# Patient Record
Sex: Female | Born: 1970 | Race: Black or African American | Hispanic: No | Marital: Married | Smoking: Never smoker
Health system: Southern US, Community
[De-identification: ages and names within clinical notes are randomized; demographics above are authoritative.]

## PROBLEM LIST (undated history)

## (undated) DIAGNOSIS — K219 Gastro-esophageal reflux disease without esophagitis: Secondary | ICD-10-CM

## (undated) DIAGNOSIS — I10 Essential (primary) hypertension: Secondary | ICD-10-CM

## (undated) DIAGNOSIS — D509 Iron deficiency anemia, unspecified: Secondary | ICD-10-CM

## (undated) DIAGNOSIS — K2970 Gastritis, unspecified, without bleeding: Secondary | ICD-10-CM

---

## 2011-07-23 ENCOUNTER — Encounter: Payer: Self-pay | Admitting: *Deleted

## 2011-07-23 ENCOUNTER — Emergency Department (HOSPITAL_BASED_OUTPATIENT_CLINIC_OR_DEPARTMENT_OTHER)
Admission: EM | Admit: 2011-07-23 | Discharge: 2011-07-23 | Disposition: A | Discharge status: Home / Self Care | Payer: 59 | Attending: Emergency Medicine | Admitting: Emergency Medicine

## 2011-07-23 DIAGNOSIS — R197 Diarrhea, unspecified: Secondary | ICD-10-CM | POA: Insufficient documentation

## 2011-07-23 DIAGNOSIS — R51 Headache: Secondary | ICD-10-CM | POA: Insufficient documentation

## 2011-07-23 DIAGNOSIS — IMO0001 Reserved for inherently not codable concepts without codable children: Secondary | ICD-10-CM | POA: Insufficient documentation

## 2011-07-23 DIAGNOSIS — R112 Nausea with vomiting, unspecified: Secondary | ICD-10-CM | POA: Insufficient documentation

## 2011-07-23 LAB — DIFFERENTIAL
Lymphocytes Relative: 23 % (ref 12–46)
Lymphs Abs: 1.3 10*3/uL (ref 0.7–4.0)
Monocytes Absolute: 0.3 10*3/uL (ref 0.1–1.0)
Monocytes Relative: 5 % (ref 3–12)
Neutro Abs: 4.1 10*3/uL (ref 1.7–7.7)
Neutrophils Relative %: 71 % (ref 43–77)

## 2011-07-23 LAB — URINALYSIS, ROUTINE W REFLEX MICROSCOPIC
Hgb urine dipstick: NEGATIVE
Ketones, ur: >80 mg/dL — AB
Protein, ur: 30 mg/dL — AB
Urobilinogen, UA: 1 mg/dL (ref 0.0–1.0)

## 2011-07-23 LAB — CBC
HCT: 32.5 % — ABNORMAL LOW (ref 36.0–46.0)
Hemoglobin: 10.5 g/dL — ABNORMAL LOW (ref 12.0–15.0)
RBC: 3.97 MIL/uL (ref 3.87–5.11)
WBC: 5.8 10*3/uL (ref 4.0–10.5)

## 2011-07-23 LAB — COMPREHENSIVE METABOLIC PANEL
AST: 15 U/L (ref 0–37)
Albumin: 4 g/dL (ref 3.5–5.2)
Alkaline Phosphatase: 89 U/L (ref 39–117)
BUN: 14 mg/dL (ref 6–23)
CO2: 25 mEq/L (ref 19–32)
Chloride: 106 mEq/L (ref 96–112)
Creatinine, Ser: 0.7 mg/dL (ref 0.50–1.10)
GFR calc non Af Amer: >90 mL/min (ref >90)
Potassium: 3.6 mEq/L (ref 3.5–5.1)
Total Bilirubin: 0.6 mg/dL (ref 0.3–1.2)

## 2011-07-23 LAB — URINE MICROSCOPIC-ADD ON

## 2011-07-23 MED ORDER — ONDANSETRON HCL 4 MG/2ML IJ SOLN
INTRAMUSCULAR | Status: AC
  Administered 2011-07-23: 4 mg
  Filled 2011-07-23: qty 2

## 2011-07-23 MED ORDER — SODIUM CHLORIDE 0.9 % IV SOLN
Freq: Once | INTRAVENOUS | Status: AC
  Administered 2011-07-23: 15:00:00 via INTRAVENOUS

## 2011-07-23 MED ORDER — ONDANSETRON 4 MG PO TBDP: 4.0000 mg | ORAL_TABLET | Freq: Three times a day (TID) | ORAL | Status: AC | PRN

## 2011-07-23 MED ORDER — HYDROMORPHONE HCL PF 1 MG/ML IJ SOLN
0.5000 mg | Freq: Once | INTRAMUSCULAR | Status: AC
  Administered 2011-07-23: 17:00:00 via INTRAVENOUS
  Filled 2011-07-23: qty 1

## 2011-07-23 MED ORDER — SODIUM CHLORIDE 0.9 % IV SOLN
Freq: Once | INTRAVENOUS | Status: AC
  Administered 2011-07-23: 17:00:00 via INTRAVENOUS

## 2011-07-23 MED ORDER — ONDANSETRON 8 MG PO TBDP
8.0000 mg | ORAL_TABLET | Freq: Once | ORAL | Status: AC
  Administered 2011-07-23: 8 mg via ORAL
  Filled 2011-07-23: qty 1

## 2011-07-23 NOTE — ED Notes (Signed)
Nausea vomiting diarrhea onset 4 days ago cant keep anything down also with chills headache feeling weak and generalized body aching

## 2011-07-23 NOTE — ED Provider Notes (Signed)
History     CSN: [id number] Arrival date & time: 07/23/2011  2:17 PM   First MD Initiated Contact with Patient 07/23/11 1449      Chief Complaint  Patient presents with  . Emesis  . Diarrhea  . Generalized Body Aches  . Headache    (Consider location/radiation/quality/duration/timing/severity/associated sxs/prior treatment) Patient is a 40 y.o. female presenting with vomiting, diarrhea, and headaches. The history is provided by the patient. No language interpreter was used.  Emesis  This is a new problem. The current episode started yesterday. The problem has been gradually worsening. The emesis has an appearance of stomach contents. The maximum temperature recorded prior to her arrival was 100 to 100.9 F. Associated symptoms include chills, diarrhea and headaches. Risk factors include ill contacts.  Diarrhea The primary symptoms include vomiting and diarrhea.  The illness is also significant for chills.  Headache  Associated symptoms include vomiting.  Pt complains of vomitting and diarrhea for the past 4 days.  History reviewed. No pertinent past medical history.  History reviewed. No pertinent past surgical history.  History reviewed. No pertinent family history.  History  Substance Use Topics  . Smoking status: Never Smoker   . Smokeless tobacco: Not on file  . Alcohol Use: No    OB History    Grav Para Term Preterm Abortions TAB SAB Ect Mult Living                  Review of Systems  Constitutional: Positive for chills.  Gastrointestinal: Positive for vomiting and diarrhea.  Neurological: Positive for headaches.  All other systems reviewed and are negative.    Allergies  Review of patient's allergies indicates no known allergies.  Home Medications  No current outpatient prescriptions on file.  BP 123/79  Pulse 70  Temp(Src) 98.8 F (37.1 C) (Oral)  Resp 20  Ht 5' (1.524 m)  Wt 165 lb (74.844 kg)  BMI 32.22 kg/m2  SpO2 100%  LMP  07/15/2011  Physical Exam  Nursing note and vitals reviewed. Constitutional: She is oriented to person, place, and time. She appears well-developed and well-nourished.  HENT:  Head: Normocephalic and atraumatic.  Right Ear: External ear normal.  Left Ear: External ear normal.  Nose: Nose normal.  Mouth/Throat: Oropharynx is clear and moist.  Eyes: Conjunctivae and EOM are normal. Pupils are equal, round, and reactive to light.  Neck: Normal range of motion. Neck supple.  Cardiovascular: Normal rate and normal heart sounds.   Pulmonary/Chest: Effort normal and breath sounds normal.  Abdominal: Soft.  Musculoskeletal: Normal range of motion.  Neurological: She is alert and oriented to person, place, and time. She has normal reflexes.  Skin: Skin is warm.  Psychiatric: She has a normal mood and affect.    ED Course  Procedures (including critical care time)  Labs Reviewed  URINALYSIS, ROUTINE W REFLEX MICROSCOPIC - Abnormal; Notable for the following:    Bilirubin Urine SMALL (*)    Ketones, ur >80 (*)    Protein, ur 30 (*)    All other components within normal limits  URINE MICROSCOPIC-ADD ON - Abnormal; Notable for the following:    Squamous Epithelial / LPF FEW (*) RARE   Bacteria, UA FEW (*)    All other components within normal limits  CBC - Abnormal; Notable for the following:    Hemoglobin 10.5 (*)    HCT 32.5 (*)    All other components within normal limits  PREGNANCY, URINE  DIFFERENTIAL  COMPREHENSIVE  METABOLIC PANEL   No results found.   No diagnosis found.    MDM  Pt given IV fluids x 2 liters.  Pt feels better,  Zofran IV,   Labs normal,  Urine shows 80 ketones,  Pt advised to continue po fluids.  Pt given rx for zofran.        Langston Masker, [place] 07/23/11 [phone]

## 2011-07-25 NOTE — ED Provider Notes (Signed)
History/physical exam/procedure(s) were performed by non-physician practitioner and as supervising physician I was immediately available for consultation/collaboration. I have reviewed all notes and am in agreement with care and plan.   Cortnie Ringel S Froylan Hobby, MD 07/25/11 0418 

## 2011-10-26 ENCOUNTER — Encounter (HOSPITAL_BASED_OUTPATIENT_CLINIC_OR_DEPARTMENT_OTHER): Payer: Self-pay | Admitting: *Deleted

## 2011-10-26 ENCOUNTER — Emergency Department (HOSPITAL_BASED_OUTPATIENT_CLINIC_OR_DEPARTMENT_OTHER)
Admission: EM | Admit: 2011-10-26 | Discharge: 2011-10-27 | Disposition: A | Discharge status: Home / Self Care | Payer: 59 | Attending: Emergency Medicine | Admitting: Emergency Medicine

## 2011-10-26 DIAGNOSIS — R51 Headache: Secondary | ICD-10-CM | POA: Insufficient documentation

## 2011-10-26 DIAGNOSIS — N760 Acute vaginitis: Secondary | ICD-10-CM | POA: Insufficient documentation

## 2011-10-26 DIAGNOSIS — A499 Bacterial infection, unspecified: Secondary | ICD-10-CM | POA: Insufficient documentation

## 2011-10-26 DIAGNOSIS — N920 Excessive and frequent menstruation with regular cycle: Secondary | ICD-10-CM | POA: Insufficient documentation

## 2011-10-26 DIAGNOSIS — N946 Dysmenorrhea, unspecified: Secondary | ICD-10-CM

## 2011-10-26 DIAGNOSIS — B9689 Other specified bacterial agents as the cause of diseases classified elsewhere: Secondary | ICD-10-CM | POA: Insufficient documentation

## 2011-10-26 LAB — CBC
MCH: 26.5 pg (ref 26.0–34.0)
MCV: 82 fL (ref 78.0–100.0)
Platelets: 337 10*3/uL (ref 150–400)
RBC: 3.66 MIL/uL — ABNORMAL LOW (ref 3.87–5.11)
RDW: 15.7 % — ABNORMAL HIGH (ref 11.5–15.5)
WBC: 7.9 10*3/uL (ref 4.0–10.5)

## 2011-10-26 LAB — URINALYSIS, ROUTINE W REFLEX MICROSCOPIC
Bilirubin Urine: NEGATIVE
Ketones, ur: NEGATIVE mg/dL
Leukocytes, UA: NEGATIVE
Nitrite: NEGATIVE
Urobilinogen, UA: 1 mg/dL (ref 0.0–1.0)
pH: 7 (ref 5.0–8.0)

## 2011-10-26 LAB — WET PREP, GENITAL
Trich, Wet Prep: NONE SEEN
WBC, Wet Prep HPF POC: NONE SEEN

## 2011-10-26 LAB — URINE MICROSCOPIC-ADD ON

## 2011-10-26 MED ORDER — OXYCODONE-ACETAMINOPHEN 5-325 MG PO TABS: 1.0000 | ORAL_TABLET | ORAL | Status: AC | PRN

## 2011-10-26 MED ORDER — METRONIDAZOLE 500 MG PO TABS: 500.0000 mg | ORAL_TABLET | Freq: Two times a day (BID) | ORAL | Status: AC

## 2011-10-26 MED ORDER — ONDANSETRON HCL 4 MG/2ML IJ SOLN
4.0000 mg | Freq: Once | INTRAMUSCULAR | Status: AC
  Administered 2011-10-26: 4 mg via INTRAVENOUS
  Filled 2011-10-26: qty 2

## 2011-10-26 MED ORDER — HYDROMORPHONE HCL PF 1 MG/ML IJ SOLN
1.0000 mg | Freq: Once | INTRAMUSCULAR | Status: AC
  Administered 2011-10-26: 1 mg via INTRAVENOUS
  Filled 2011-10-26: qty 1

## 2011-10-26 MED ORDER — KETOROLAC TROMETHAMINE 30 MG/ML IJ SOLN
30.0000 mg | Freq: Once | INTRAMUSCULAR | Status: AC
  Administered 2011-10-26: 30 mg via INTRAVENOUS
  Filled 2011-10-26: qty 1

## 2011-10-26 MED ORDER — MEDROXYPROGESTERONE ACETATE 10 MG PO TABS: 10.0000 mg | ORAL_TABLET | Freq: Every day | ORAL | Status: DC

## 2011-10-26 NOTE — ED Notes (Signed)
Pt having vaginal bleeding, stated "went through one box of pads today & having severe headaches".

## 2011-10-26 NOTE — Discharge Instructions (Signed)
Abnormal Uterine Bleeding Abnormal uterine bleeding can have many causes. Some cases are simply treated, while others are more serious. There are several kinds of bleeding that is considered abnormal, including:  Bleeding between periods.   Bleeding after sexual intercourse.   Spotting anytime in the menstrual cycle.   Bleeding heavier or more than normal.   Bleeding after menopause.  CAUSES  There are many causes of abnormal uterine bleeding. It can be present in teenagers, pregnant women, women during their reproductive years, and women who have reached menopause. Your caregiver will look for the more common causes depending on your age, signs, symptoms and your particular circumstance. Most cases are not serious and can be treated. Even the more serious causes, like cancer of the female organs, can be treated adequately if found in the early stages. That is why all types of bleeding should be evaluated and treated as soon as possible. DIAGNOSIS  Diagnosing the cause may take several kinds of tests. Your caregiver may:  Take a complete history of the type of bleeding.   Perform a complete physical exam and Pap smear.   Take an ultrasound on the abdomen showing a picture of the female organs and the pelvis.   Inject dye into the uterus and Fallopian tubes and X-ray them (hysterosalpingogram).   Place fluid in the uterus and do an ultrasound (sonohysterogrqphy).   Take a CT scan to examine the female organs and pelvis.   Take an MRI to examine the female organs and pelvis. There is no X-ray involved with this procedure.   Look inside the uterus with a telescope that has a light at the end (hysteroscopy).   Scrap the inside of the uterus to get tissue to examine (Dilatation and Curettage, D&C).   Look into the pelvis with a telescope that has a light at the end (laparoscopy). This is done through a very small cut (incision) in the abdomen.  TREATMENT  Treatment will depend on the  cause of the abnormal bleeding. It can include:  Doing nothing to allow the problem to take care of itself over time.   Hormone treatment.   Birth control pills.   Treating the medical condition causing the problem.   Laparoscopy.   Major or minor surgery   Destroying the lining of the uterus with electrical currant, laser, freezing or heat (uterine ablation).  HOME CARE INSTRUCTIONS   Follow your caregiver's recommendation on how to treat your problem.   See your caregiver if you missed a menstrual period and think you may be pregnant.   If you are bleeding heavily, count the number of pads/tampons you use and how often you have to change them. Tell this to your caregiver.   Avoid sexual intercourse until the problem is controlled.  SEEK MEDICAL CARE IF:   You have any kind of abnormal bleeding mentioned above.   You feel dizzy at times.   You are 41 years old and have not had a menstrual period yet.  SEEK IMMEDIATE MEDICAL CARE IF:   You pass out.   You are changing pads/tampons every 15 to 30 minutes.   You have belly (abdominal) pain.   You have a temperature of 100 F (37.8 C) or higher.   You become sweaty or weak.   You are passing large blood clots from the vagina.   You start to feel sick to your stomach (nauseous) and throw up (vomit).  Document Released: 08/06/2005 Document Revised: 07/26/2011 Document Reviewed: 12/30/2008 [employer]   Patient Information 2012 [place], [place].  Bacterial Vaginosis Bacterial vaginosis (BV) is a vaginal infection where the normal balance of bacteria in the vagina is disrupted. The normal balance is then replaced by an overgrowth of certain bacteria. There are several different kinds of bacteria that can cause BV. BV is the most common vaginal infection in women of childbearing age. CAUSES   The cause of BV is not fully understood. BV develops when there is an increase or imbalance of harmful bacteria.   Some activities or  behaviors can upset the normal balance of bacteria in the vagina and put women at increased risk including:   Having a new sex partner or multiple sex partners.   Douching.   Using an intrauterine device (IUD) for contraception.   It is not clear what role sexual activity plays in the development of BV. However, women that have never had sexual intercourse are rarely infected with BV.  Women do not get BV from toilet seats, bedding, swimming pools or from touching objects around them.  SYMPTOMS   Grey vaginal discharge.   A fish-like odor with discharge, especially after sexual intercourse.   Itching or burning of the vagina and vulva.   Burning or pain with urination.   Some women have no signs or symptoms at all.  DIAGNOSIS  Your caregiver must examine the vagina for signs of BV. Your caregiver will perform lab tests and look at the sample of vaginal fluid through a microscope. They will look for bacteria and abnormal cells (clue cells), a pH test higher than 4.5, and a positive amine test all associated with BV.  RISKS AND COMPLICATIONS   Pelvic inflammatory disease (PID).   Infections following gynecology surgery.   Developing HIV.   Developing herpes virus.  TREATMENT  Sometimes BV will clear up without treatment. However, all women with symptoms of BV should be treated to avoid complications, especially if gynecology surgery is planned. Female partners generally do not need to be treated. However, BV may spread between female sex partners so treatment is helpful in preventing a recurrence of BV.   BV may be treated with antibiotics. The antibiotics come in either pill or vaginal cream forms. Either can be used with nonpregnant or pregnant women, but the recommended dosages differ. These antibiotics are not harmful to the baby.   BV can recur after treatment. If this happens, a second round of antibiotics will often be prescribed.   Treatment is important for pregnant women.  If not treated, BV can cause a premature delivery, especially for a pregnant woman who had a premature birth in the past. All pregnant women who have symptoms of BV should be checked and treated.   For chronic reoccurrence of BV, treatment with a type of prescribed gel vaginally twice a week is helpful.  HOME CARE INSTRUCTIONS   Finish all medication as directed by your caregiver.   Do not have sex until treatment is completed.   Tell your sexual partner that you have a vaginal infection. They should see their caregiver and be treated if they have problems, such as a mild rash or itching.   Practice safe sex. Use condoms. Only have 1 sex partner.  PREVENTION  Basic prevention steps can help reduce the risk of upsetting the natural balance of bacteria in the vagina and developing BV:  Do not have sexual intercourse (be abstinent).   Do not douche.   Use all of the medicine prescribed for treatment of BV, even  if the signs and symptoms go away.   Tell your sex partner if you have BV. That way, they can be treated, if needed, to prevent reoccurrence.  SEEK MEDICAL CARE IF:   Your symptoms are not improving after 3 days of treatment.   You have increased discharge, pain, or fever.  MAKE SURE YOU:   Understand these instructions.   Will watch your condition.   Will get help right away if you are not doing well or get worse.  FOR MORE INFORMATION  Division of STD Prevention (DSTDP), Centers for Disease Control and Prevention: [employer] Association (ASHA): www.ashastd.org  Document Released: 08/06/2005 Document Revised: 07/26/2011 Document Reviewed: 01/27/2009 [facility] Patient Information 2012 [place], [place].  Metronidazole tablets or capsules What is this medicine? METRONIDAZOLE (me troe NI da zole) is an antiinfective. It is used to treat certain kinds of bacterial and protozoal infections. It will not work for colds, flu, or other viral  infections. This medicine may be used for other purposes; ask your health care provider or pharmacist if you have questions. What should I tell my health care provider before I take this medicine? They need to know if you have any of these conditions: -anemia or other blood disorders -disease of the nervous system -fungal or yeast infection -if you drink alcohol containing drinks -liver disease -seizures -an unusual or allergic reaction to metronidazole, or other medicines, foods, dyes, or preservatives -pregnant or trying to get pregnant -breast-feeding How should I use this medicine? Take this medicine by mouth with a full glass of water. Follow the directions on the prescription label. Take your medicine at regular intervals. Do not take your medicine more often than directed. Take all of your medicine as directed even if you think you are better. Do not skip doses or stop your medicine early. Talk to your pediatrician regarding the use of this medicine in children. Special care may be needed. Overdosage: If you think you have taken too much of this medicine contact a poison control center or emergency room at once. NOTE: This medicine is only for you. Do not share this medicine with others. What if I miss a dose? If you miss a dose, take it as soon as you can. If it is almost time for your next dose, take only that dose. Do not take double or extra doses. What may interact with this medicine? Do not take this medicine with any of the following medications: -alcohol or any product that contains alcohol -amprenavir oral solution -disulfiram -paclitaxel injection -ritonavir oral solution -sertraline oral solution -sulfamethoxazole-trimethoprim injection This medicine may also interact with the following medications: -cimetidine -lithium -phenobarbital -phenytoin -warfarin This list may not describe all possible interactions. Give your health care provider a list of all the  medicines, herbs, non-prescription drugs, or dietary supplements you use. Also tell them if you smoke, drink alcohol, or use illegal drugs. Some items may interact with your medicine. What should I watch for while using this medicine? Tell your doctor or health care professional if your symptoms do not improve or if they get worse. You may get drowsy or dizzy. Do not drive, use machinery, or do anything that needs mental alertness until you know how this medicine affects you. Do not stand or sit up quickly, especially if you are an older patient. This reduces the risk of dizzy or fainting spells. Avoid alcoholic drinks while you are taking this medicine and for three days afterward. Alcohol may make you feel  dizzy, sick, or flushed. If you are being treated for a sexually transmitted disease, avoid sexual contact until you have finished your treatment. Your sexual partner may also need treatment. What side effects may I notice from receiving this medicine? Side effects that you should report to your doctor or health care professional as soon as possible: -allergic reactions like skin rash or hives, swelling of the face, lips, or tongue -confusion, clumsiness -difficulty speaking -discolored or sore mouth -dizziness -fever, infection -numbness, tingling, pain or weakness in the hands or feet -trouble passing urine or change in the amount of urine -redness, blistering, peeling or loosening of the skin, including inside the mouth -seizures -unusually weak or tired -vaginal irritation, dryness, or discharge Side effects that usually do not require medical attention (report to your doctor or health care professional if they continue or are bothersome): -diarrhea -headache -irritability -metallic taste -nausea -stomach pain or cramps -trouble sleeping This list may not describe all possible side effects. Call your doctor for medical advice about side effects. You may report side effects to FDA  at 1-800-FDA-1088. Where should I keep my medicine? Keep out of the reach of children. Store at room temperature below 25 degrees C (77 degrees F). Protect from light. Keep container tightly closed. Throw away any unused medicine after the expiration date. NOTE: This sheet is a summary. It may not cover all possible information. If you have questions about this medicine, talk to your doctor, pharmacist, or health care provider.  2012, [employer]. (05/24/2008 10:37:11 AM)Medroxyprogesterone tablets What is this medicine? MEDROXYPROGESTERONE (me DROX ee proe JES te rone) is a hormone in a class called progestins. It is commonly used to prevent the uterine lining from overgrowth in women taking an estrogen after menopause. It is also used to treat irregular menstrual bleeding or a lack of menstrual bleeding in women. This medicine may be used for other purposes; ask your health care provider or pharmacist if you have questions. What should I tell my health care provider before I take this medicine? They need to know if you have any of these conditions: -blood vessel disease or a history of a blood clot in the lungs or legs -breast, cervical or vaginal cancer -heart disease -kidney disease -liver disease -migraine -recent miscarriage or abortion -mental depression -migraine -seizures (convulsions) -stroke -vaginal bleeding that has not been evaluated -an unusual or allergic reaction to medroxyprogesterone, other medicines, foods, dyes, or preservatives -pregnant or trying to get pregnant -breast-feeding How should I use this medicine? Take this medicine by mouth with a glass of water. Follow the directions on the prescription label. Take your doses at regular intervals. Do not take your medicine more often than directed. Talk to your pediatrician regarding the use of this medicine in children. Special care may be needed. While this drug may be prescribed for children as young as  13 years for selected conditions, precautions do apply. Overdosage: If you think you have taken too much of this medicine contact a poison control center or emergency room at once. NOTE: This medicine is only for you. Do not share this medicine with others. What if I miss a dose? If you miss a dose, take it as soon as you can. If it is almost time for your next dose, take only that dose. Do not take double or extra doses. What may interact with this medicine? -barbiturate medicines for inducing sleep or treating seizures (convulsions) -bosentan -carbamazepine -phenytoin -rifampin -St. John's Wort This list may  not describe all possible interactions. Give your health care provider a list of all the medicines, herbs, non-prescription drugs, or dietary supplements you use. Also tell them if you smoke, drink alcohol, or use illegal drugs. Some items may interact with your medicine. What should I watch for while using this medicine? Visit your health care professional for regular checks on your progress. You will need a regular breast and pelvic exam. If you have any reason to think you are pregnant, stop taking this medicine at once and contact your doctor or health care professional. What side effects may I notice from receiving this medicine? Side effects that you should report to your doctor or health care professional as soon as possible: -breast tenderness or discharge -changes in mood or emotions, such as depression -changes in vision or speech -pain in the abdomen, chest, groin, or leg -severe headache -skin rash, itching, or hives -sudden shortness of breath -unusually weak or tired -yellowing of skin or eyes Side effects that usually do not require medical attention (report to your doctor or health care professional if they continue or are bothersome): -acne -change in menstrual bleeding pattern or flow -changes in sexual desire -facial hair growth -fluid retention and  swelling -headache -upset stomach -weight gain or loss This list may not describe all possible side effects. Call your doctor for medical advice about side effects. You may report side effects to FDA at 1-800-FDA-1088. Where should I keep my medicine? Keep out of the reach of children. Store at room temperature between 20 and 25 degrees C (68 and 77 degrees F). Throw away any unused medicine after the expiration date. NOTE: This sheet is a summary. It may not cover all possible information. If you have questions about this medicine, talk to your doctor, pharmacist, or health care provider.  2012, [employer] Standard. (08/05/2008 11:26:12 AM)  Acetaminophen; Oxycodone tablets What is this medicine? ACETAMINOPHEN; OXYCODONE (a set a MEE noe fen; ox i KOE done) is a pain reliever. It is used to treat mild to moderate pain. This medicine may be used for other purposes; ask your health care provider or pharmacist if you have questions. What should I tell my health care provider before I take this medicine? They need to know if you have any of these conditions: -brain tumor -Crohn's disease, inflammatory bowel disease, or ulcerative colitis -drink more than 3 alcohol containing drinks per day -drug abuse or addiction -head injury -heart or circulation problems -kidney disease or problems going to the bathroom -liver disease -lung disease, asthma, or breathing problems -an unusual or allergic reaction to acetaminophen, oxycodone, other opioid analgesics, other medicines, foods, dyes, or preservatives -pregnant or trying to get pregnant -breast-feeding How should I use this medicine? Take this medicine by mouth with a full glass of water. Follow the directions on the prescription label. Take your medicine at regular intervals. Do not take your medicine more often than directed. Talk to your pediatrician regarding the use of this medicine in children. Special care may be needed. Patients  over 28 years old may have a stronger reaction and need a smaller dose. Overdosage: If you think you have taken too much of this medicine contact a poison control center or emergency room at once. NOTE: This medicine is only for you. Do not share this medicine with others. What if I miss a dose? If you miss a dose, take it as soon as you can. If it is almost time for your next dose, take only  that dose. Do not take double or extra doses. What may interact with this medicine? -alcohol or medicines that contain alcohol -antihistamines -barbiturates like amobarbital, butalbital, butabarbital, methohexital, pentobarbital, phenobarbital, thiopental, and secobarbital -benztropine -drugs for bladder problems like solifenacin, trospium, oxybutynin, tolterodine, hyoscyamine, and methscopolamine -drugs for breathing problems like ipratropium and tiotropium -drugs for certain stomach or intestine problems like propantheline, homatropine methylbromide, glycopyrrolate, atropine, belladonna, and dicyclomine -general anesthetics like etomidate, ketamine, nitrous oxide, propofol, desflurane, enflurane, halothane, isoflurane, and sevoflurane -medicines for depression, anxiety, or psychotic disturbances -medicines for pain like codeine, morphine, pentazocine, buprenorphine, butorphanol, nalbuphine, tramadol, and propoxyphene -medicines for sleep -muscle relaxants -naltrexone -phenothiazines like perphenazine, thioridazine, chlorpromazine, mesoridazine, fluphenazine, prochlorperazine, promazine, and trifluoperazine -scopolamine -trihexyphenidyl This list may not describe all possible interactions. Give your health care provider a list of all the medicines, herbs, non-prescription drugs, or dietary supplements you use. Also tell them if you smoke, drink alcohol, or use illegal drugs. Some items may interact with your medicine. What should I watch for while using this medicine? Tell your doctor or health care  professional if your pain does not go away, if it gets worse, or if you have new or a different type of pain. You may develop tolerance to the medicine. Tolerance means that you will need a higher dose of the medication for pain relief. Tolerance is normal and is expected if you take this medicine for a long time. Do not suddenly stop taking your medicine because you may develop a severe reaction. Your body becomes used to the medicine. This does NOT mean you are addicted. Addiction is a behavior related to getting and using a drug for a nonmedical reason. If you have pain, you have a medical reason to take pain medicine. Your doctor will tell you how much medicine to take. If your doctor wants you to stop the medicine, the dose will be slowly lowered over time to avoid any side effects. You may get drowsy or dizzy. Do not drive, use machinery, or do anything that needs mental alertness until you know how this medicine affects you. Do not stand or sit up quickly, especially if you are an older patient. This reduces the risk of dizzy or fainting spells. Alcohol may interfere with the effect of this medicine. Avoid alcoholic drinks. The medicine will cause constipation. Try to have a bowel movement at least every 2 to 3 days. If you do not have a bowel movement for 3 days, call your doctor or health care professional. Do not take Tylenol (acetaminophen) or medicines that have acetaminophen with this medicine. Too much acetaminophen can be very dangerous. Many nonprescription medicines contain acetaminophen. Always read the labels carefully to avoid taking more acetaminophen. What side effects may I notice from receiving this medicine? Side effects that you should report to your doctor or health care professional as soon as possible: -allergic reactions like skin rash, itching or hives, swelling of the face, lips, or tongue -breathing difficulties, wheezing -confusion -light headedness or fainting  spells -severe stomach pain -yellowing of the skin or the whites of the eyes Side effects that usually do not require medical attention (report to your doctor or health care professional if they continue or are bothersome): -dizziness -drowsiness -nausea -vomiting This list may not describe all possible side effects. Call your doctor for medical advice about side effects. You may report side effects to FDA at 1-800-FDA-1088. Where should I keep my medicine? Keep out of the reach of children. This medicine can  be abused. Keep your medicine in a safe place to protect it from theft. Do not share this medicine with anyone. Selling or giving away this medicine is dangerous and against the law. Store at room temperature between 20 and 25 degrees C (68 and 77 degrees F). Keep container tightly closed. Protect from light. Flush any unused medicines down the toilet. Do not use the medicine after the expiration date. NOTE: This sheet is a summary. It may not cover all possible information. If you have questions about this medicine, talk to your doctor, pharmacist, or health care provider.  2012, [employer] Standard. (07/05/2008 10:01:21 AM)

## 2011-10-26 NOTE — ED Provider Notes (Signed)
History     CSN: [id number]  Arrival date & time 10/26/11  7829   First MD Initiated Contact with Patient 10/26/11 1951      Chief Complaint  Patient presents with  . Vaginal Bleeding    (Consider location/radiation/quality/duration/timing/severity/associated sxs/prior treatment) Patient is a 41 y.o. female presenting with vaginal bleeding. The history is provided by the patient.  Vaginal Bleeding   she had onset of vaginal bleeding and suprapubic cramping yesterday. This is associated with a mild headache. This was appropriate time for onset of her menses. However, her pain and headache and amount of bleeding are much more than normal. Today she is going through the entire box of pads. She has been passing clots. Cramping has been severe and she rates it at 7/10 currently but has been as severe as 8/10. It is generally worse just prior to passing a clot. Headache is bitemporal. She states that she uses condoms with every episode of intercourse and denies unprotected sex. She has not had any fever, chills, sweats. There's been no nausea or vomiting or diarrhea.  History reviewed. No pertinent past medical history.  History reviewed. No pertinent past surgical history.  History reviewed. No pertinent family history.  History  Substance Use Topics  . Smoking status: Never Smoker   . Smokeless tobacco: Not on file  . Alcohol Use: No    OB History    Grav Para Term Preterm Abortions TAB SAB Ect Mult Living                  Review of Systems  Genitourinary: Positive for vaginal bleeding.  All other systems reviewed and are negative.    Allergies  Review of patient's allergies indicates no known allergies.  Home Medications   Current Outpatient Rx  Name Route Sig Dispense Refill  . NAPROXEN SODIUM 220 MG PO TABS Oral Take 220 mg by mouth 2 (two) times daily with a meal. Patient took this medication for cramps.      BP 153/80  Pulse 83  Temp(Src) 98.1 F (36.7 C)  (Oral)  Resp 18  Ht 5' (1.524 m)  Wt 160 lb (72.576 kg)  BMI 31.25 kg/m2  SpO2 100%  LMP 10/25/2011  Physical Exam  Nursing note and vitals reviewed.  41 year old female who is resting comfortably and in no acute distress. Vital signs are significant for mild hypertension with blood pressure 149/90. Oxygen saturation is 100% which is normal. It is normocephalic and atraumatic. PERRLA, EOMI. Fundi show no hemorrhage, exudate, or papilledema. Neck is nontender and supple. Back is nontender. Lungs are clear without rales, wheezes, rhonchi. Heart has regular rate and rhythm without murmur. Abdomen is soft, flat, with moderate suprapubic tenderness. There is no rebound or guarding. Peristalsis is present. Pelvic exam: Normal external female genitalia, small amount of blood present in the vaginal vault with no active bleeding, cervix appears normal, fundus is top normal in size and has mild tenderness diffusely, no adnexal masses or tenderness. Extremities have no cyanosis or edema, full range of motion is present. Skin is warm and dry without rash. Neurologic: Mental status is normal, cranial nerves are intact, there no focal motor or sensory deficits.  ED Course  Procedures (including critical care time)  Results for orders placed during the hospital encounter of 10/26/11  CBC      Component Value Range   WBC 7.9  4.0 - 10.5 (K/uL)   RBC 3.66 (*) 3.87 - 5.11 (MIL/uL)   Hemoglobin 9.7 (*)  12.0 - 15.0 (g/dL)   HCT 16.1 (*) 09.6 - 46.0 (%)   MCV 82.0  78.0 - 100.0 (fL)   MCH 26.5  26.0 - 34.0 (pg)   MCHC 32.3  30.0 - 36.0 (g/dL)   RDW 04.5 (*) 40.9 - 15.5 (%)   Platelets 337  150 - 400 (K/uL)  URINALYSIS, ROUTINE W REFLEX MICROSCOPIC      Component Value Range   Color, Urine YELLOW  YELLOW    APPearance CLEAR  CLEAR    Specific Gravity, Urine 1.023  1.005 - 1.030    pH 7.0  5.0 - 8.0    Glucose, UA NEGATIVE  NEGATIVE (mg/dL)   Hgb urine dipstick LARGE (*) NEGATIVE    Bilirubin Urine  NEGATIVE  NEGATIVE    Ketones, ur NEGATIVE  NEGATIVE (mg/dL)   Protein, ur NEGATIVE  NEGATIVE (mg/dL)   Urobilinogen, UA 1.0  0.0 - 1.0 (mg/dL)   Nitrite NEGATIVE  NEGATIVE    Leukocytes, UA NEGATIVE  NEGATIVE   PREGNANCY, URINE      Component Value Range   Preg Test, Ur NEGATIVE  NEGATIVE   WET PREP, GENITAL      Component Value Range   Yeast Wet Prep HPF POC NONE SEEN  NONE SEEN    Trich, Wet Prep NONE SEEN  NONE SEEN    Clue Cells Wet Prep HPF POC MODERATE (*) NONE SEEN    WBC, Wet Prep HPF POC NONE SEEN  NONE SEEN   URINE MICROSCOPIC-ADD ON      Component Value Range   Squamous Epithelial / LPF RARE  RARE    RBC / HPF 7-10  <3 (RBC/hpf)   Bacteria, UA RARE  RARE      She was given IV fluids and a dose of Toradol with no relief. She's given a dose of hydromorphone and ondansetron.  She got good relief of pain with hydromorphone. She is sent home with prescription for Provera, Flagyl, and Percocet. She is to followup with her PCP next week.  1. Menorrhagia   2. Dysmenorrhea   3. Bacterial vaginosis       MDM  Dysmenorrhea and menorrhagia. CBC will be checked to evaluate for blood loss. Review of old records show that she had a hemoglobin done in December which was 10.5.        Dione Booze, MD 10/27/11 [phone]

## 2011-12-31 ENCOUNTER — Encounter (HOSPITAL_BASED_OUTPATIENT_CLINIC_OR_DEPARTMENT_OTHER): Payer: Self-pay | Admitting: *Deleted

## 2011-12-31 ENCOUNTER — Emergency Department (HOSPITAL_BASED_OUTPATIENT_CLINIC_OR_DEPARTMENT_OTHER)
Admission: EM | Admit: 2011-12-31 | Discharge: 2011-12-31 | Disposition: A | Discharge status: Home / Self Care | Payer: 59 | Attending: Emergency Medicine | Admitting: Emergency Medicine

## 2011-12-31 DIAGNOSIS — J309 Allergic rhinitis, unspecified: Secondary | ICD-10-CM | POA: Insufficient documentation

## 2011-12-31 DIAGNOSIS — J302 Other seasonal allergic rhinitis: Secondary | ICD-10-CM

## 2011-12-31 DIAGNOSIS — R51 Headache: Secondary | ICD-10-CM

## 2011-12-31 MED ORDER — DIPHENHYDRAMINE HCL 50 MG/ML IJ SOLN
25.0000 mg | Freq: Once | INTRAMUSCULAR | Status: AC
  Administered 2011-12-31: 25 mg via INTRAMUSCULAR
  Filled 2011-12-31: qty 1

## 2011-12-31 MED ORDER — FLUTICASONE PROPIONATE 50 MCG/ACT NA SUSP: 2.0000 | Freq: Every day | NASAL | Status: DC

## 2011-12-31 MED ORDER — METOCLOPRAMIDE HCL 5 MG/ML IJ SOLN
10.0000 mg | Freq: Once | INTRAMUSCULAR | Status: AC
  Administered 2011-12-31: 10 mg via INTRAMUSCULAR
  Filled 2011-12-31: qty 2

## 2011-12-31 MED ORDER — AMOXICILLIN 500 MG PO CAPS: 500.0000 mg | ORAL_CAPSULE | Freq: Three times a day (TID) | ORAL | Status: AC

## 2011-12-31 MED ORDER — KETOROLAC TROMETHAMINE 30 MG/ML IJ SOLN
60.0000 mg | Freq: Once | INTRAMUSCULAR | Status: AC
  Administered 2011-12-31: 60 mg via INTRAMUSCULAR
  Filled 2011-12-31: qty 2

## 2011-12-31 NOTE — ED Notes (Signed)
Patient states she has had allergy problems for several weeks, states last night she took benadryl and developed a headache.  Describes headache and pressure and is located in her above her eyebrows on her forehead.

## 2011-12-31 NOTE — ED Provider Notes (Signed)
History     CSN: [id number]  Arrival date & time 12/31/11  1229   First MD Initiated Contact with Patient 12/31/11 1302      Chief Complaint  Patient presents with  . Headache  . URI    (Consider location/radiation/quality/duration/timing/severity/associated sxs/prior treatment) Patient is a 41 y.o. female presenting with headaches. The history is provided by the patient. No language interpreter was used.  Headache  This is a new problem. The current episode started yesterday. The problem occurs constantly. The problem has not changed since onset.The pain is located in the temporal region. The quality of the pain is described as throbbing. The pain is moderate. The pain does not radiate. Pertinent negatives include no fever, no nausea and no vomiting. Improvement on treatment: benadryl.    History reviewed. No pertinent past medical history.  History reviewed. No pertinent past surgical history.  No family history on file.  History  Substance Use Topics  . Smoking status: Never Smoker   . Smokeless tobacco: Not on file  . Alcohol Use: No    OB History    Grav Para Term Preterm Abortions TAB SAB Ect Mult Living                  Review of Systems  Constitutional: Negative for fever.  Cardiovascular: Negative.   Gastrointestinal: Negative for nausea and vomiting.  Musculoskeletal: Negative.   Neurological: Positive for headaches.    Allergies  Review of patient's allergies indicates no known allergies.  Home Medications   Current Outpatient Rx  Name Route Sig Dispense Refill  . FLUTICASONE PROPIONATE 50 MCG/ACT NA SUSP Nasal Place 2 sprays into the nose daily. 16 g 0  . MEDROXYPROGESTERONE ACETATE 10 MG PO TABS Oral Take 1 tablet (10 mg total) by mouth daily. 5 tablet 0  . NAPROXEN SODIUM 220 MG PO TABS Oral Take 220 mg by mouth 2 (two) times daily with a meal. Patient took this medication for cramps.      BP 125/69  Pulse 71  Temp(Src) 98.6 F (37 C)  (Oral)  Resp 20  Ht 5\' 5"  (1.651 m)  Wt 167 lb (75.751 kg)  BMI 27.79 kg/m2  SpO2 100%  LMP 12/10/2011  Physical Exam  Nursing note and vitals reviewed. Constitutional: She is oriented to person, place, and time. She appears well-developed and well-nourished.  HENT:  Right Ear: External ear normal.  Left Ear: External ear normal.  Nose: Mucosal edema and rhinorrhea present.  Mouth/Throat: Posterior oropharyngeal erythema present.  Eyes: Conjunctivae and EOM are normal. Pupils are equal, round, and reactive to light.  Neck: Neck supple.  Cardiovascular: Normal rate and regular rhythm.   Pulmonary/Chest: Effort normal and breath sounds normal.  Musculoskeletal: Normal range of motion.  Neurological: She is alert and oriented to person, place, and time.  Skin: Skin is warm and dry.  Psychiatric: She has a normal mood and affect.    ED Course  Procedures (including critical care time)  Labs Reviewed - No data to display No results found.   1. Seasonal allergies   2. Headache       MDM  Pt feeling a little better with headache at this time:will treat with antibiotics at this time for possible sinusitis        Teressa Lower, NP 12/31/11 1433

## 2011-12-31 NOTE — Discharge Instructions (Signed)
Allergies, Generic Allergies may happen from anything your body is sensitive to. This may be food, medicines, pollens, chemicals, and nearly anything around you in everyday life that produces allergens. An allergen is anything that causes an allergy producing substance. Heredity is often a factor in causing these problems. This means you may have some of the same allergies as your parents. Food allergies happen in all age groups. Food allergies are some of the most severe and life threatening. Some common food allergies are cow's milk, seafood, eggs, nuts, wheat, and soybeans. SYMPTOMS   Swelling around the mouth.   An itchy red rash or hives.   Vomiting or diarrhea.   Difficulty breathing.  SEVERE ALLERGIC REACTIONS ARE LIFE-THREATENING. This reaction is called anaphylaxis. It can cause the mouth and throat to swell and cause difficulty with breathing and swallowing. In severe reactions only a trace amount of food (for example, peanut oil in a salad) may cause death within seconds. Seasonal allergies occur in all age groups. These are seasonal because they usually occur during the same season every year. They may be a reaction to molds, grass pollens, or tree pollens. Other causes of problems are house dust mite allergens, pet dander, and mold spores. The symptoms often consist of nasal congestion, a runny itchy nose associated with sneezing, and tearing itchy eyes. There is often an associated itching of the mouth and ears. The problems happen when you come in contact with pollens and other allergens. Allergens are the particles in the air that the body reacts to with an allergic reaction. This causes you to release allergic antibodies. Through a chain of events, these eventually cause you to release histamine into the blood stream. Although it is meant to be protective to the body, it is this release that causes your discomfort. This is why you were given anti-histamines to feel better. If you are  unable to pinpoint the offending allergen, it may be determined by skin or blood testing. Allergies cannot be cured but can be controlled with medicine. Hay fever is a collection of all or some of the seasonal allergy problems. It may often be treated with simple over-the-counter medicine such as diphenhydramine. Take medicine as directed. Do not drink alcohol or drive while taking this medicine. Check with your caregiver or package insert for child dosages. If these medicines are not effective, there are many new medicines your caregiver can prescribe. Stronger medicine such as nasal spray, eye drops, and corticosteroids may be used if the first things you try do not work well. Other treatments such as immunotherapy or desensitizing injections can be used if all else fails. Follow up with your caregiver if problems continue. These seasonal allergies are usually not life threatening. They are generally more of a nuisance that can often be handled using medicine. HOME CARE INSTRUCTIONS   If unsure what causes a reaction, keep a diary of foods eaten and symptoms that follow. Avoid foods that cause reactions.   If hives or rash are present:   Take medicine as directed.   You may use an over-the-counter antihistamine (diphenhydramine) for hives and itching as needed.   Apply cold compresses (cloths) to the skin or take baths in cool water. Avoid hot baths or showers. Heat will make a rash and itching worse.   If you are severely allergic:   Following a treatment for a severe reaction, hospitalization is often required for closer follow-up.   Wear a medic-alert bracelet or necklace stating the allergy.     You and your family must learn how to give adrenaline or use an anaphylaxis kit.   If you have had a severe reaction, always carry your anaphylaxis kit or EpiPen with you. Use this medicine as directed by your caregiver if a severe reaction is occurring. Failure to do so could have a fatal  outcome.  SEEK MEDICAL CARE IF:  You suspect a food allergy. Symptoms generally happen within 30 minutes of eating a food.   Your symptoms have not gone away within 2 days or are getting worse.   You develop new symptoms.   You want to retest yourself or your child with a food or drink you think causes an allergic reaction. Never do this if an anaphylactic reaction to that food or drink has happened before. Only do this under the care of a caregiver.  SEEK IMMEDIATE MEDICAL CARE IF:   You have difficulty breathing, are wheezing, or have a tight feeling in your chest or throat.   You have a swollen mouth, or you have hives, swelling, or itching all over your body.   You have had a severe reaction that has responded to your anaphylaxis kit or an EpiPen. These reactions may return when the medicine has worn off. These reactions should be considered life threatening.  MAKE SURE YOU:   Understand these instructions.   Will watch your condition.   Will get help right away if you are not doing well or get worse.  Document Released: 10/30/2002 Document Revised: 07/26/2011 Document Reviewed: 04/05/2008 [facility] Patient Information 2012 [place], [place].Headaches, Frequently Asked Questions MIGRAINE HEADACHES Q: What is migraine? What causes it? How can I treat it? A: Generally, migraine headaches begin as a dull ache. Then they develop into a constant, throbbing, and pulsating pain. You may experience pain at the temples. You may experience pain at the front or back of one or both sides of the head. The pain is usually accompanied by a combination of:  Nausea.   Vomiting.   Sensitivity to light and noise.  Some people (about 15%) experience an aura (see below) before an attack. The cause of migraine is believed to be chemical reactions in the brain. Treatment for migraine may include over-the-counter or prescription medications. It may also include self-help techniques. These include  relaxation training and biofeedback.  Q: What is an aura? A: About 15% of people with migraine get an "aura". This is a sign of neurological symptoms that occur before a migraine headache. You may see wavy or jagged lines, dots, or flashing lights. You might experience tunnel vision or blind spots in one or both eyes. The aura can include visual or auditory hallucinations (something imagined). It may include disruptions in smell (such as strange odors), taste or touch. Other symptoms include:  Numbness.   A "pins and needles" sensation.   Difficulty in recalling or speaking the correct word.  These neurological events may last as long as 60 minutes. These symptoms will fade as the headache begins. Q: What is a trigger? A: Certain physical or environmental factors can lead to or "trigger" a migraine. These include:  Foods.   Hormonal changes.   Weather.   Stress.  It is important to remember that triggers are different for everyone. To help prevent migraine attacks, you need to figure out which triggers affect you. Keep a headache diary. This is a good way to track triggers. The diary will help you talk to your healthcare professional about your condition. Q: Does weather  affect migraines? A: Bright sunshine, hot, humid conditions, and drastic changes in barometric pressure may lead to, or "trigger," a migraine attack in some people. But studies have shown that weather does not act as a trigger for everyone with migraines. Q: What is the link between migraine and hormones? A: Hormones start and regulate many of your body's functions. Hormones keep your body in balance within a constantly changing environment. The levels of hormones in your body are unbalanced at times. Examples are during menstruation, pregnancy, or menopause. That can lead to a migraine attack. In fact, about three quarters of all women with migraine report that their attacks are related to the menstrual cycle.  Q: Is there  an increased risk of stroke for migraine sufferers? A: The likelihood of a migraine attack causing a stroke is very remote. That is not to say that migraine sufferers cannot have a stroke associated with their migraines. In persons under age 53, the most common associated factor for stroke is migraine headache. But over the course of a person's normal life span, the occurrence of migraine headache may actually be associated with a reduced risk of dying from cerebrovascular disease due to stroke.  Q: What are acute medications for migraine? A: Acute medications are used to treat the pain of the headache after it has started. Examples over-the-counter medications, NSAIDs, ergots, and triptans.  Q: What are the triptans? A: Triptans are the newest class of abortive medications. They are specifically targeted to treat migraine. Triptans are vasoconstrictors. They moderate some chemical reactions in the brain. The triptans work on receptors in your brain. Triptans help to restore the balance of a neurotransmitter called serotonin. Fluctuations in levels of serotonin are thought to be a main cause of migraine.  Q: Are over-the-counter medications for migraine effective? A: Over-the-counter, or "OTC," medications may be effective in relieving mild to moderate pain and associated symptoms of migraine. But you should see your caregiver before beginning any treatment regimen for migraine.  Q: What are preventive medications for migraine? A: Preventive medications for migraine are sometimes referred to as "prophylactic" treatments. They are used to reduce the frequency, severity, and length of migraine attacks. Examples of preventive medications include antiepileptic medications, antidepressants, beta-blockers, calcium channel blockers, and NSAIDs (nonsteroidal anti-inflammatory drugs). Q: Why are anticonvulsants used to treat migraine? A: During the past few years, there has been an increased interest in  antiepileptic drugs for the prevention of migraine. They are sometimes referred to as "anticonvulsants". Both epilepsy and migraine may be caused by similar reactions in the brain.  Q: Why are antidepressants used to treat migraine? A: Antidepressants are typically used to treat people with depression. They may reduce migraine frequency by regulating chemical levels, such as serotonin, in the brain.  Q: What alternative therapies are used to treat migraine? A: The term "alternative therapies" is often used to describe treatments considered outside the scope of conventional Western medicine. Examples of alternative therapy include acupuncture, acupressure, and yoga. Another common alternative treatment is herbal therapy. Some herbs are believed to relieve headache pain. Always discuss alternative therapies with your caregiver before proceeding. Some herbal products contain arsenic and other toxins. TENSION HEADACHES Q: What is a tension-type headache? What causes it? How can I treat it? A: Tension-type headaches occur randomly. They are often the result of temporary stress, anxiety, fatigue, or anger. Symptoms include soreness in your temples, a tightening band-like sensation around your head (a "vice-like" ache). Symptoms can also include a pulling feeling,  pressure sensations, and contracting head and neck muscles. The headache begins in your forehead, temples, or the back of your head and neck. Treatment for tension-type headache may include over-the-counter or prescription medications. Treatment may also include self-help techniques such as relaxation training and biofeedback. CLUSTER HEADACHES Q: What is a cluster headache? What causes it? How can I treat it? A: Cluster headache gets its name because the attacks come in groups. The pain arrives with little, if any, warning. It is usually on one side of the head. A tearing or bloodshot eye and a runny nose on the same side of the headache may also  accompany the pain. Cluster headaches are believed to be caused by chemical reactions in the brain. They have been described as the most severe and intense of any headache type. Treatment for cluster headache includes prescription medication and oxygen. SINUS HEADACHES Q: What is a sinus headache? What causes it? How can I treat it? A: When a cavity in the bones of the face and skull (a sinus) becomes inflamed, the inflammation will cause localized pain. This condition is usually the result of an allergic reaction, a tumor, or an infection. If your headache is caused by a sinus blockage, such as an infection, you will probably have a fever. An x-ray will confirm a sinus blockage. Your caregiver's treatment might include antibiotics for the infection, as well as antihistamines or decongestants.  REBOUND HEADACHES Q: What is a rebound headache? What causes it? How can I treat it? A: A pattern of taking acute headache medications too often can lead to a condition known as "rebound headache." A pattern of taking too much headache medication includes taking it more than 2 days per week or in excessive amounts. That means more than the label or a caregiver advises. With rebound headaches, your medications not only stop relieving pain, they actually begin to cause headaches. Doctors treat rebound headache by tapering the medication that is being overused. Sometimes your caregiver will gradually substitute a different type of treatment or medication. Stopping may be a challenge. Regularly overusing a medication increases the potential for serious side effects. Consult a caregiver if you regularly use headache medications more than 2 days per week or more than the label advises. ADDITIONAL QUESTIONS AND ANSWERS Q: What is biofeedback? A: Biofeedback is a self-help treatment. Biofeedback uses special equipment to monitor your body's involuntary physical responses. Biofeedback monitors:  Breathing.   Pulse.    Heart rate.   Temperature.   Muscle tension.   Brain activity.  Biofeedback helps you refine and perfect your relaxation exercises. You learn to control the physical responses that are related to stress. Once the technique has been mastered, you do not need the equipment any more. Q: Are headaches hereditary? A: Four out of five (80%) of people that suffer report a family history of migraine. Scientists are not sure if this is genetic or a family predisposition. Despite the uncertainty, a child has a 50% chance of having migraine if one parent suffers. The child has a 75% chance if both parents suffer.  Q: Can children get headaches? A: By the time they reach high school, most young people have experienced some type of headache. Many safe and effective approaches or medications can prevent a headache from occurring or stop it after it has begun.  Q: What type of doctor should I see to diagnose and treat my headache? A: Start with your primary caregiver. Discuss his or her experience and approach  to headaches. Discuss methods of classification, diagnosis, and treatment. Your caregiver may decide to recommend you to a headache specialist, depending upon your symptoms or other physical conditions. Having diabetes, allergies, etc., may require a more comprehensive and inclusive approach to your headache. The National [employer] Foundation will provide, upon request, a list of [facility] physician members in your state. Document Released: 10/27/2003 Document Revised: 07/26/2011 Document Reviewed: 04/05/2008 [facility] Patient Information 2012 [place], [place].

## 2012-01-05 NOTE — ED Provider Notes (Signed)
Medical screening examination/treatment/procedure(s) were performed by non-physician practitioner and as supervising physician I was immediately available for consultation/collaboration.   Shelda Jakes, MD 01/05/12 [phone]

## 2012-02-09 ENCOUNTER — Encounter (HOSPITAL_BASED_OUTPATIENT_CLINIC_OR_DEPARTMENT_OTHER): Payer: Self-pay | Admitting: *Deleted

## 2012-02-09 ENCOUNTER — Emergency Department (HOSPITAL_BASED_OUTPATIENT_CLINIC_OR_DEPARTMENT_OTHER)
Admission: EM | Admit: 2012-02-09 | Discharge: 2012-02-10 | Disposition: A | Discharge status: Home / Self Care | Payer: 59 | Attending: Emergency Medicine | Admitting: Emergency Medicine

## 2012-02-09 DIAGNOSIS — T169XXA Foreign body in ear, unspecified ear, initial encounter: Secondary | ICD-10-CM

## 2012-02-09 DIAGNOSIS — H6090 Unspecified otitis externa, unspecified ear: Secondary | ICD-10-CM

## 2012-02-09 DIAGNOSIS — Y998 Other external cause status: Secondary | ICD-10-CM | POA: Insufficient documentation

## 2012-02-09 DIAGNOSIS — IMO0002 Reserved for concepts with insufficient information to code with codable children: Secondary | ICD-10-CM | POA: Insufficient documentation

## 2012-02-09 DIAGNOSIS — H60399 Other infective otitis externa, unspecified ear: Secondary | ICD-10-CM | POA: Insufficient documentation

## 2012-02-09 MED ORDER — NEOMYCIN-POLYMYXIN-HC 3.5-10000-1 OT SUSP: 4.0000 [drp] | Freq: Three times a day (TID) | OTIC | Status: AC

## 2012-02-09 MED ORDER — OXYCODONE-ACETAMINOPHEN 5-325 MG PO TABS
2.0000 | ORAL_TABLET | Freq: Once | ORAL | Status: AC
  Administered 2012-02-09: 2 via ORAL
  Filled 2012-02-09: qty 2

## 2012-02-09 MED ORDER — OXYCODONE-ACETAMINOPHEN 5-325 MG PO TABS: 2.0000 | ORAL_TABLET | ORAL | Status: AC | PRN

## 2012-02-09 NOTE — Discharge Instructions (Signed)
Ear Foreign Body  An ear foreign body is an object that is stuck in the ear. It is common for young children to put objects into the ear canal. These may include pebbles, beads, beans, and any other small objects which will fit. In adults, objects such as cotton swabs may become lodged in the ear canal. In all ages, the most common foreign bodies are insects that enter the ear canal.   SYMPTOMS   Foreign bodies may cause pain, buzzing or roaring sounds, hearing loss, and ear drainage.   HOME CARE INSTRUCTIONS    Keep all follow-up appointments with your caregiver as told.   Keep small objects out of reach of young children. Tell them not to put anything in their ears.  SEEK IMMEDIATE MEDICAL CARE IF:    You have bleeding from the ear.   You have increased pain or swelling of the ear.   You have reduced hearing.   You have discharge coming from the ear.   You have a fever.   You have a headache.  MAKE SURE YOU:    Understand these instructions.   Will watch your condition.   Will get help right away if you are not doing well or get worse.  Document Released: 08/03/2000 Document Revised: 07/26/2011 Document Reviewed: 03/24/2008  [employer] Patient Information 2012 [employer].

## 2012-02-09 NOTE — ED Notes (Signed)
Pt states she thinks she has the end of a Q-tip in her right ear which has been there for 2 months.

## 2012-02-09 NOTE — ED Provider Notes (Addendum)
History   This chart was scribed for Rolan Bucco, MD by Charolett Bumpers . The patient was seen in room MH07/MH07.    CSN: [id number]  Arrival date & time 02/09/12  2207   First MD Initiated Contact with Patient 02/09/12 2221      Chief Complaint  Patient presents with  . Foreign Body in Ear    (Consider location/radiation/quality/duration/timing/severity/associated sxs/prior treatment) HPI Yesenia Mills is a 41 y.o. female who presents to the Emergency Department complaining of constant, moderate right ear pain for the past week. Patient states that 2 months ago, she broke off the cotton part of a q-tib in her right ear. Patient states that she tried using peroxide to get the q-tip out but was not successful. Patient denies any ear drainage or fevers. Patient denies any n/v, cough, congestion, rhinorrhea and headache. Patient reports no pertinent medical or surgical hx. No associated symptoms reported or modifying factors.    History reviewed. No pertinent past medical history.  History reviewed. No pertinent past surgical history.  History reviewed. No pertinent family history.  History  Substance Use Topics  . Smoking status: Never Smoker   . Smokeless tobacco: Not on file  . Alcohol Use: No    OB History    Grav Para Term Preterm Abortions TAB SAB Ect Mult Living                  Review of Systems  Constitutional: Negative for fever.  HENT: Positive for ear pain. Negative for congestion, rhinorrhea and ear discharge.   Respiratory: Negative for cough.   Gastrointestinal: Negative for nausea and vomiting.  Neurological: Negative for headaches.  All other systems reviewed and are negative.    Allergies  Review of patient's allergies indicates no known allergies.  Home Medications   Current Outpatient Rx  Name Route Sig Dispense Refill  . FLUTICASONE PROPIONATE 50 MCG/ACT NA SUSP Nasal Place 2 sprays into the nose daily. 16 g 0  .  MEDROXYPROGESTERONE ACETATE 10 MG PO TABS Oral Take 1 tablet (10 mg total) by mouth daily. 5 tablet 0  . NAPROXEN SODIUM 220 MG PO TABS Oral Take 220 mg by mouth 2 (two) times daily with a meal. Patient took this medication for cramps.    . NEOMYCIN-POLYMYXIN-HC 3.5-10000-1 OT SUSP Otic Place 4 drops in ear(s) 3 (three) times daily. 10 mL 0  . OXYCODONE-ACETAMINOPHEN 5-325 MG PO TABS Oral Take 2 tablets by mouth every 4 (four) hours as needed for pain. 15 tablet 0    BP 133/100  Pulse 67  Temp 98.5 F (36.9 C) (Oral)  Resp 20  Ht 5\' 2"  (1.575 m)  Wt 170 lb (77.111 kg)  BMI 31.09 kg/m2  SpO2 99%  LMP 01/15/2012  Physical Exam  Nursing note and vitals reviewed. Constitutional: She is oriented to person, place, and time. She appears well-developed and well-nourished. No distress.  HENT:  Head: Normocephalic and atraumatic.  Right Ear: There is drainage. No mastoid tenderness.       Right ear canal swollen, exudates present.  Possible cotton vs pus adjacent to TM.  No pain over the mastoid.   Eyes: EOM are normal. Pupils are equal, round, and reactive to light.  Neck: Neck supple. No tracheal deviation present.  Cardiovascular: Normal rate.   Pulmonary/Chest: Effort normal. No respiratory distress.  Abdominal: Soft. She exhibits no distension.  Musculoskeletal: Normal range of motion. She exhibits no edema.  Neurological: She is alert and oriented to  person, place, and time. No sensory deficit.  Skin: Skin is warm and dry.  Psychiatric: She has a normal mood and affect. Her behavior is normal.    ED Course  Procedures (including critical care time)  DIAGNOSTIC STUDIES: Oxygen Saturation is 99% on room air, normal by my interpretation.    COORDINATION OF CARE:  2230: Discussed planned course of treatment with the patient, who is agreeable at this time.     Labs Reviewed - No data to display No results found.   1. Otitis externa   2. Foreign body in ear       MDM    I have attempted several times to remove item from ear.  Ear canal is swollen with exudate and I am unable to get anything out of ear.  Discussed with Dr. Jenne Pane with ENT.  Will start abx ear drops, pain meds, f/u with ENT  I personally performed the services described in this documentation, which was scribed in my presence.  The recorded information has been reviewed and considered.        Rolan Bucco, MD 02/09/12 [id number]  Rolan Bucco, MD 02/09/12 2314

## 2012-09-14 ENCOUNTER — Emergency Department (HOSPITAL_BASED_OUTPATIENT_CLINIC_OR_DEPARTMENT_OTHER)
Admission: EM | Admit: 2012-09-14 | Discharge: 2012-09-14 | Disposition: A | Discharge status: Home / Self Care | Payer: 59 | Attending: Emergency Medicine | Admitting: Emergency Medicine

## 2012-09-14 ENCOUNTER — Encounter (HOSPITAL_BASED_OUTPATIENT_CLINIC_OR_DEPARTMENT_OTHER): Payer: Self-pay | Admitting: Emergency Medicine

## 2012-09-14 DIAGNOSIS — R197 Diarrhea, unspecified: Secondary | ICD-10-CM | POA: Insufficient documentation

## 2012-09-14 DIAGNOSIS — R112 Nausea with vomiting, unspecified: Secondary | ICD-10-CM | POA: Insufficient documentation

## 2012-09-14 DIAGNOSIS — D649 Anemia, unspecified: Secondary | ICD-10-CM | POA: Insufficient documentation

## 2012-09-14 DIAGNOSIS — Z3202 Encounter for pregnancy test, result negative: Secondary | ICD-10-CM | POA: Insufficient documentation

## 2012-09-14 LAB — URINALYSIS, ROUTINE W REFLEX MICROSCOPIC
Bilirubin Urine: NEGATIVE
Glucose, UA: NEGATIVE mg/dL
Hgb urine dipstick: NEGATIVE
Ketones, ur: NEGATIVE mg/dL
Protein, ur: NEGATIVE mg/dL
pH: 7.5 (ref 5.0–8.0)

## 2012-09-14 LAB — COMPREHENSIVE METABOLIC PANEL
ALT: 8 U/L (ref 0–35)
AST: 15 U/L (ref 0–37)
Albumin: 3.9 g/dL (ref 3.5–5.2)
Alkaline Phosphatase: 78 U/L (ref 39–117)
BUN: 13 mg/dL (ref 6–23)
Chloride: 103 mEq/L (ref 96–112)
Potassium: 4.3 mEq/L (ref 3.5–5.1)
Sodium: 138 mEq/L (ref 135–145)
Total Bilirubin: 0.5 mg/dL (ref 0.3–1.2)

## 2012-09-14 LAB — CBC WITH DIFFERENTIAL/PLATELET
Basophils Relative: 0 % (ref 0–1)
Hemoglobin: 10.2 g/dL — ABNORMAL LOW (ref 12.0–15.0)
MCHC: 31.4 g/dL (ref 30.0–36.0)
Monocytes Relative: 3 % (ref 3–12)
Neutro Abs: 7.5 10*3/uL (ref 1.7–7.7)
Neutrophils Relative %: 88 % — ABNORMAL HIGH (ref 43–77)
Platelets: 334 10*3/uL (ref 150–400)
RBC: 4.01 MIL/uL (ref 3.87–5.11)

## 2012-09-14 MED ORDER — ONDANSETRON HCL 4 MG/2ML IJ SOLN
4.0000 mg | Freq: Once | INTRAMUSCULAR | Status: AC
  Administered 2012-09-14: 4 mg via INTRAVENOUS
  Filled 2012-09-14: qty 2

## 2012-09-14 MED ORDER — SODIUM CHLORIDE 0.9 % IV BOLUS (SEPSIS)
1000.0000 mL | Freq: Once | INTRAVENOUS | Status: AC
  Administered 2012-09-14: 1000 mL via INTRAVENOUS

## 2012-09-14 MED ORDER — ONDANSETRON 8 MG PO TBDP: 8.0000 mg | ORAL_TABLET | Freq: Three times a day (TID) | ORAL | Status: DC | PRN

## 2012-09-14 MED ORDER — HYDROMORPHONE HCL PF 1 MG/ML IJ SOLN
1.0000 mg | Freq: Once | INTRAMUSCULAR | Status: AC
  Administered 2012-09-14: 1 mg via INTRAVENOUS
  Filled 2012-09-14: qty 1

## 2012-09-14 NOTE — ED Provider Notes (Signed)
History     CSN: [id number]  Arrival date & time 09/14/12  6213   First MD Initiated Contact with Patient 09/14/12 0701      Chief Complaint  Patient presents with  . Abdominal Pain    (Consider location/radiation/quality/duration/timing/severity/associated sxs/prior treatment) Patient is a 42 y.o. female presenting with abdominal pain. The history is provided by the patient.  Abdominal Pain The primary symptoms of the illness include abdominal pain, fever, nausea, vomiting and diarrhea. The primary symptoms of the illness do not include fatigue, shortness of breath, hematemesis, hematochezia, dysuria, vaginal discharge or vaginal bleeding. The current episode started 6 to 12 hours ago. The onset of the illness was sudden. The problem has not changed since onset. The patient states that she believes she is currently not pregnant. The patient has not had a change in bowel habit. Symptoms associated with the illness do not include chills, diaphoresis, heartburn, urgency or frequency. Significant associated medical issues do not include PUD, GERD, inflammatory bowel disease, diabetes, sickle cell disease, gallstones, liver disease, substance abuse, diverticulitis, HIV or cardiac disease.  Pain is crampy and diffuse associated with vomiting and diarrhea.   History reviewed. No pertinent past medical history.  History reviewed. No pertinent past surgical history.  History reviewed. No pertinent family history.  History  Substance Use Topics  . Smoking status: Never Smoker   . Smokeless tobacco: Not on file  . Alcohol Use: No    OB History    Grav Para Term Preterm Abortions TAB SAB Ect Mult Living                  Review of Systems  Constitutional: Positive for fever. Negative for chills, diaphoresis and fatigue.  Respiratory: Negative for shortness of breath.   Gastrointestinal: Positive for nausea, vomiting, abdominal pain and diarrhea. Negative for heartburn, hematochezia and  hematemesis.  Genitourinary: Negative for dysuria, urgency, frequency, vaginal bleeding and vaginal discharge.  All other systems reviewed and are negative.    Allergies  Review of patient's allergies indicates no known allergies.  Home Medications   Current Outpatient Rx  Name  Route  Sig  Dispense  Refill  . NAPROXEN SODIUM 220 MG PO TABS   Oral   Take 220 mg by mouth 2 (two) times daily with a meal. Patient used this medication for her earache.           BP 135/86  Pulse 85  Temp 98.5 F (36.9 C) (Oral)  Resp 20  Ht 5' (1.524 m)  Wt 170 lb (77.111 kg)  BMI 33.20 kg/m2  SpO2 100%  LMP 08/14/2012  Physical Exam  Nursing note and vitals reviewed. Constitutional: She appears well-developed and well-nourished.  HENT:  Head: Normocephalic and atraumatic.  Eyes: Conjunctivae normal and EOM are normal. Pupils are equal, round, and reactive to light.  Neck: Normal range of motion. Neck supple.  Cardiovascular: Normal rate, regular rhythm, normal heart sounds and intact distal pulses.   Pulmonary/Chest: Effort normal and breath sounds normal.  Abdominal: Soft. Bowel sounds are normal. She exhibits no distension and no mass. There is no tenderness. There is no rebound and no guarding.  Musculoskeletal: Normal range of motion.  Neurological: She is alert.  Skin: Skin is warm and dry.  Psychiatric: She has a normal mood and affect. Thought content normal.    ED Course  Procedures (including critical care time)  Labs Reviewed  CBC WITH DIFFERENTIAL - Abnormal; Notable for the following:    Hemoglobin  10.2 (*)     HCT 32.5 (*)     MCH 25.4 (*)     RDW 16.1 (*)     Neutrophils Relative 88 (*)     Lymphocytes Relative 8 (*)     All other components within normal limits  COMPREHENSIVE METABOLIC PANEL  URINALYSIS, ROUTINE W REFLEX MICROSCOPIC  PREGNANCY, URINE   No results found.   No diagnosis found.   Results for orders placed during the hospital encounter of  09/14/12  CBC WITH DIFFERENTIAL      Component Value Range   WBC 8.5  4.0 - 10.5 K/uL   RBC 4.01  3.87 - 5.11 MIL/uL   Hemoglobin 10.2 (*) 12.0 - 15.0 g/dL   HCT 45.4 (*) 09.8 - 11.9 %   MCV 81.0  78.0 - 100.0 fL   MCH 25.4 (*) 26.0 - 34.0 pg   MCHC 31.4  30.0 - 36.0 g/dL   RDW 14.7 (*) 82.9 - 56.2 %   Platelets 334  150 - 400 K/uL   Neutrophils Relative 88 (*) 43 - 77 %   Neutro Abs 7.5  1.7 - 7.7 K/uL   Lymphocytes Relative 8 (*) 12 - 46 %   Lymphs Abs 0.7  0.7 - 4.0 K/uL   Monocytes Relative 3  3 - 12 %   Monocytes Absolute 0.3  0.1 - 1.0 K/uL   Eosinophils Relative 0  0 - 5 %   Eosinophils Absolute 0.0  0.0 - 0.7 K/uL   Basophils Relative 0  0 - 1 %   Basophils Absolute 0.0  0.0 - 0.1 K/uL  COMPREHENSIVE METABOLIC PANEL      Component Value Range   Sodium 138  135 - 145 mEq/L   Potassium 4.3  3.5 - 5.1 mEq/L   Chloride 103  96 - 112 mEq/L   CO2 23  19 - 32 mEq/L   Glucose, Bld 115 (*) 70 - 99 mg/dL   BUN 13  6 - 23 mg/dL   Creatinine, Ser 1.30  0.50 - 1.10 mg/dL   Calcium 9.6  8.4 - 86.5 mg/dL   Total Protein 8.1  6.0 - 8.3 g/dL   Albumin 3.9  3.5 - 5.2 g/dL   AST 15  0 - 37 U/L   ALT 8  0 - 35 U/L   Alkaline Phosphatase 78  39 - 117 U/L   Total Bilirubin 0.5  0.3 - 1.2 mg/dL   GFR calc non Af Amer 90 (*) >90 mL/min   GFR calc Af Amer >90  >90 mL/min  URINALYSIS, ROUTINE W REFLEX MICROSCOPIC      Component Value Range   Color, Urine YELLOW  YELLOW   APPearance CLEAR  CLEAR   Specific Gravity, Urine 1.015  1.005 - 1.030   pH 7.5  5.0 - 8.0   Glucose, UA NEGATIVE  NEGATIVE mg/dL   Hgb urine dipstick NEGATIVE  NEGATIVE   Bilirubin Urine NEGATIVE  NEGATIVE   Ketones, ur NEGATIVE  NEGATIVE mg/dL   Protein, ur NEGATIVE  NEGATIVE mg/dL   Urobilinogen, UA 0.2  0.0 - 1.0 mg/dL   Nitrite NEGATIVE  NEGATIVE   Leukocytes, UA NEGATIVE  NEGATIVE  PREGNANCY, URINE      Component Value Range   Preg Test, Ur NEGATIVE  NEGATIVE    MDM  Patient had iv fluids and zofran.   No vomiting or diarrhea since she has been here.  She is taking clear liquids and feels improved.  Anemia noted and appears stable from prior labs.  Patient advised regarding gi symptoms and understands symptoms to return for.         Hilario Quarry, MD 09/14/12 [phone]

## 2012-09-14 NOTE — ED Notes (Signed)
Abdominal pain n/v since 1am

## 2013-03-28 ENCOUNTER — Emergency Department (HOSPITAL_BASED_OUTPATIENT_CLINIC_OR_DEPARTMENT_OTHER)
Admission: EM | Admit: 2013-03-28 | Discharge: 2013-03-28 | Disposition: A | Discharge status: Home / Self Care | Payer: 59 | Attending: Emergency Medicine | Admitting: Emergency Medicine

## 2013-03-28 ENCOUNTER — Encounter (HOSPITAL_BASED_OUTPATIENT_CLINIC_OR_DEPARTMENT_OTHER): Payer: Self-pay | Admitting: *Deleted

## 2013-03-28 DIAGNOSIS — J029 Acute pharyngitis, unspecified: Secondary | ICD-10-CM | POA: Diagnosis present

## 2013-03-28 MED ORDER — DEXAMETHASONE 4 MG PO TABS
10.0000 mg | ORAL_TABLET | ORAL | Status: AC
  Administered 2013-03-28: 10 mg via ORAL
  Filled 2013-03-28: qty 3

## 2013-03-28 MED ORDER — IBUPROFEN 400 MG PO TABS
400.0000 mg | ORAL_TABLET | Freq: Once | ORAL | Status: AC
  Administered 2013-03-28: 400 mg via ORAL
  Filled 2013-03-28: qty 1

## 2013-03-28 MED ORDER — OXYCODONE-ACETAMINOPHEN 5-325 MG PO TABS: 1.0000 | ORAL_TABLET | Freq: Four times a day (QID) | ORAL | Status: DC | PRN

## 2013-03-28 NOTE — ED Notes (Addendum)
Patient c/o sore throat/hurts to swallow, left ear pain, took tylenol no relief

## 2013-03-28 NOTE — ED Provider Notes (Signed)
CSN: [id number]     Arrival date & time 03/28/13  6440 History     First MD Initiated Contact with Patient 03/28/13 [phone]     Chief Complaint  Patient presents with  . Sore Throat   (Consider location/radiation/quality/duration/timing/severity/associated sxs/prior Treatment) Patient is a 42 y.o. female presenting with pharyngitis. The history is provided by the patient.  Sore Throat This is a new problem. The current episode started more than 1 week ago. The problem occurs constantly. The problem has been gradually worsening. Pertinent negatives include no chest pain, no abdominal pain, no headaches and no shortness of breath. Exacerbated by: eating. Nothing relieves the symptoms. Treatments tried: tylenol, chloraseptic spray. The treatment provided mild relief.    History reviewed. No pertinent past medical history. History reviewed. No pertinent past surgical history. No family history on file. History  Substance Use Topics  . Smoking status: Never Smoker   . Smokeless tobacco: Not on file  . Alcohol Use: No   OB History   Grav Para Term Preterm Abortions TAB SAB Ect Mult Living                 Review of Systems  Constitutional: Negative for fever and fatigue.  HENT: Positive for sore throat. Negative for congestion, drooling and neck pain.   Eyes: Negative for pain.  Respiratory: Negative for cough and shortness of breath.   Cardiovascular: Negative for chest pain.  Gastrointestinal: Negative for nausea, vomiting, abdominal pain and diarrhea.  Genitourinary: Negative for dysuria and hematuria.  Musculoskeletal: Negative for back pain and gait problem.  Skin: Negative for color change.  Neurological: Negative for dizziness and headaches.  Hematological: Negative for adenopathy.  Psychiatric/Behavioral: Negative for behavioral problems.  All other systems reviewed and are negative.    Allergies  Review of patient's allergies indicates no known allergies.  Home  Medications   Current Outpatient Rx  Name  Route  Sig  Dispense  Refill  . naproxen sodium (ANAPROX) 220 MG tablet   Oral   Take 220 mg by mouth 2 (two) times daily with a meal. Patient used this medication for her earache.         . ondansetron (ZOFRAN ODT) 8 MG disintegrating tablet   Oral   Take 1 tablet (8 mg total) by mouth every 8 (eight) hours as needed for nausea.   20 tablet   0   . oxyCODONE-acetaminophen (PERCOCET) 5-325 MG per tablet   Oral   Take 1 tablet by mouth every 6 (six) hours as needed for pain.   10 tablet   0    BP 139/71  Pulse 71  Temp(Src) 98.4 F (36.9 C) (Oral)  Resp 19  SpO2 100% Physical Exam  Nursing note and vitals reviewed. Constitutional: She is oriented to person, place, and time. She appears well-developed and well-nourished.  HENT:  Head: Normocephalic.  Mouth/Throat: No oropharyngeal exudate.  Only mild erythema in the posterior oropharynx which is otherwise normal in appearance. No neck rigidity. Normal range of motion of neck. No trismus. Mild bilateral anterior cervical adenopathy.  Eyes: Conjunctivae and EOM are normal. Pupils are equal, round, and reactive to light.  Neck: Normal range of motion. Neck supple.  Cardiovascular: Normal rate, regular rhythm, normal heart sounds and intact distal pulses.  Exam reveals no gallop and no friction rub.   No murmur heard. Pulmonary/Chest: Effort normal and breath sounds normal. No respiratory distress. She has no wheezes.  Abdominal: Soft. Bowel sounds are normal. There  is no tenderness. There is no rebound and no guarding.  Musculoskeletal: Normal range of motion. She exhibits no edema and no tenderness.  Neurological: She is alert and oriented to person, place, and time.  Skin: Skin is warm and dry.  Psychiatric: She has a normal mood and affect. Her behavior is normal.    ED Course   Procedures (including critical care time)  Labs Reviewed  RAPID STREP SCREEN  CULTURE, GROUP A  STREP   No results found. 1. Pharyngitis     MDM  8:54 AM 43 y.o. female pw sore that x 2 weeks, worse in last few days. Centor of 2, will cx. No neck rigidity, trismus, only mild erythema in pos oropharynx. Strep neg here. Doubt rpa/pta. Decadron x 1 here, percocet for home.   8:54 AM:  I have discussed the diagnosis/risks/treatment options with the patient and believe the pt to be eligible for discharge home to follow-up with pcp as needed. We also discussed returning to the ED immediately if new or worsening sx occur. We discussed the sx which are most concerning (e.g., neck stiffness, inability to swallow, sob, development of high fever) that necessitate immediate return. Any new prescriptions provided to the patient are listed below.  New Prescriptions   OXYCODONE-ACETAMINOPHEN (PERCOCET) 5-325 MG PER TABLET    Take 1 tablet by mouth every 6 (six) hours as needed for pain.      Junius Argyle, MD 03/28/13 [phone]

## 2013-03-28 NOTE — ED Notes (Signed)
MD at bedside. 

## 2013-03-30 LAB — CULTURE, GROUP A STREP

## 2013-04-08 ENCOUNTER — Encounter (HOSPITAL_BASED_OUTPATIENT_CLINIC_OR_DEPARTMENT_OTHER): Payer: Self-pay

## 2013-04-08 ENCOUNTER — Emergency Department (HOSPITAL_BASED_OUTPATIENT_CLINIC_OR_DEPARTMENT_OTHER)
Admission: EM | Admit: 2013-04-08 | Discharge: 2013-04-09 | Disposition: A | Discharge status: Home / Self Care | Payer: 59 | Attending: Emergency Medicine | Admitting: Emergency Medicine

## 2013-04-08 DIAGNOSIS — D649 Anemia, unspecified: Secondary | ICD-10-CM

## 2013-04-08 DIAGNOSIS — Z79899 Other long term (current) drug therapy: Secondary | ICD-10-CM | POA: Insufficient documentation

## 2013-04-08 DIAGNOSIS — N390 Urinary tract infection, site not specified: Secondary | ICD-10-CM | POA: Insufficient documentation

## 2013-04-08 DIAGNOSIS — N898 Other specified noninflammatory disorders of vagina: Secondary | ICD-10-CM | POA: Insufficient documentation

## 2013-04-08 DIAGNOSIS — Z791 Long term (current) use of non-steroidal anti-inflammatories (NSAID): Secondary | ICD-10-CM | POA: Insufficient documentation

## 2013-04-08 DIAGNOSIS — N939 Abnormal uterine and vaginal bleeding, unspecified: Secondary | ICD-10-CM

## 2013-04-08 DIAGNOSIS — Z3202 Encounter for pregnancy test, result negative: Secondary | ICD-10-CM | POA: Insufficient documentation

## 2013-04-08 LAB — URINE MICROSCOPIC-ADD ON

## 2013-04-08 LAB — CBC
Hemoglobin: 9.1 g/dL — ABNORMAL LOW (ref 12.0–15.0)
MCH: 25.1 pg — ABNORMAL LOW (ref 26.0–34.0)
Platelets: 373 10*3/uL (ref 150–400)
RBC: 3.62 MIL/uL — ABNORMAL LOW (ref 3.87–5.11)
WBC: 8.2 10*3/uL (ref 4.0–10.5)

## 2013-04-08 LAB — URINALYSIS, ROUTINE W REFLEX MICROSCOPIC
Ketones, ur: 40 mg/dL — AB
Protein, ur: 100 mg/dL — AB
Urobilinogen, UA: 0.2 mg/dL (ref 0.0–1.0)

## 2013-04-08 LAB — WET PREP, GENITAL
Clue Cells Wet Prep HPF POC: NONE SEEN
Trich, Wet Prep: NONE SEEN

## 2013-04-08 LAB — PREGNANCY, URINE: Preg Test, Ur: NEGATIVE

## 2013-04-08 MED ORDER — IBUPROFEN 800 MG PO TABS
800.0000 mg | ORAL_TABLET | Freq: Once | ORAL | Status: AC
  Administered 2013-04-08: 800 mg via ORAL

## 2013-04-08 MED ORDER — IBUPROFEN 800 MG PO TABS
ORAL_TABLET | ORAL | Status: AC
  Filled 2013-04-08: qty 1

## 2013-04-08 NOTE — ED Notes (Signed)
C/o vaginal bleeding with clots started today-LMP 7/16-vomiting started yesterday

## 2013-04-08 NOTE — ED Notes (Signed)
MD at bedside. 

## 2013-04-09 MED ORDER — FERROUS SULFATE 325 (65 FE) MG PO TABS: 325.0000 mg | ORAL_TABLET | Freq: Every day | ORAL | Status: DC

## 2013-04-09 MED ORDER — IBUPROFEN 800 MG PO TABS: 800.0000 mg | ORAL_TABLET | Freq: Three times a day (TID) | ORAL | Status: DC

## 2013-04-09 MED ORDER — CEPHALEXIN 500 MG PO CAPS: 500.0000 mg | ORAL_CAPSULE | Freq: Four times a day (QID) | ORAL | Status: DC

## 2013-04-09 NOTE — ED Provider Notes (Signed)
CSN: [id number]     Arrival date & time 04/08/13  2159 History     First MD Initiated Contact with Patient 04/08/13 2304     Chief Complaint  Patient presents with  . Vaginal Bleeding   (Consider location/radiation/quality/duration/timing/severity/associated sxs/prior Treatment) HPI Pt presents with c/o vaginal bleeding.  Pt states bleeding started today, she has been passing clots of blood.  No fever, lower abdominal cramping.  No dysuria.  She states she did have an episode of emesis yesterday. LMP was 7/16.  She also states she felt dizzy today and as though she might faint.  She did not faint.  No chest pain or palpitations.  No headache.  There are no other associated systemic symptoms, there are no other alleviating or modifying factors.   History reviewed. No pertinent past medical history. History reviewed. No pertinent past surgical history. No family history on file. History  Substance Use Topics  . Smoking status: Never Smoker   . Smokeless tobacco: Not on file  . Alcohol Use: No   OB History   Grav Para Term Preterm Abortions TAB SAB Ect Mult Living                 Review of Systems ROS reviewed and all otherwise negative except for mentioned in HPI  Allergies  Review of patient's allergies indicates no known allergies.  Home Medications   Current Outpatient Rx  Name  Route  Sig  Dispense  Refill  . cephALEXin (KEFLEX) 500 MG capsule   Oral   Take 1 capsule (500 mg total) by mouth 4 (four) times daily.   28 capsule   0   . ferrous sulfate 325 (65 FE) MG tablet   Oral   Take 1 tablet (325 mg total) by mouth daily.   30 tablet   0   . ibuprofen (ADVIL,MOTRIN) 800 MG tablet   Oral   Take 1 tablet (800 mg total) by mouth 3 (three) times daily.   30 tablet   0   . naproxen sodium (ANAPROX) 220 MG tablet   Oral   Take 220 mg by mouth 2 (two) times daily with a meal. Patient used this medication for her earache.         . ondansetron (ZOFRAN ODT) 8  MG disintegrating tablet   Oral   Take 1 tablet (8 mg total) by mouth every 8 (eight) hours as needed for nausea.   20 tablet   0   . oxyCODONE-acetaminophen (PERCOCET) 5-325 MG per tablet   Oral   Take 1 tablet by mouth every 6 (six) hours as needed for pain.   10 tablet   0    BP 126/83  Pulse 102  Temp(Src) 98.6 F (37 C) (Oral)  Resp 20  Ht 5' (1.524 m)  Wt 171 lb (77.565 kg)  BMI 33.4 kg/m2  SpO2 99%  LMP 03/04/2013 Vitals reviewed Physical Exam Physical Examination: General appearance - alert, well appearing, and in no distress Mental status - alert, oriented to person, place, and time Eyes - no conjunctival injection, no scleral icterus Mouth - mucous membranes moist, pharynx normal without lesions Chest - clear to auscultation, no wheezes, rales or rhonchi, symmetric air entry Heart - normal rate, regular rhythm, normal S1, S2, no murmurs, rubs, clicks or gallops Abdomen - soft, nontender, nondistended, no masses or organomegaly Pelvic - normal external genitalia, vulva, vagina, cervix, uterus and adnexa, moderate blood in vaginal vault Extremities - peripheral pulses normal, no  pedal edema, no clubbing or cyanosis Skin - normal coloration and turgor, no rashes  ED Course   Procedures (including critical care time)  Labs Reviewed  WET PREP, GENITAL - Abnormal; Notable for the following:    WBC, Wet Prep HPF POC FEW (*)    All other components within normal limits  URINALYSIS, ROUTINE W REFLEX MICROSCOPIC - Abnormal; Notable for the following:    Color, Urine RED (*)    APPearance TURBID (*)    Hgb urine dipstick LARGE (*)    Bilirubin Urine LARGE (*)    Ketones, ur 40 (*)    Protein, ur 100 (*)    Nitrite POSITIVE (*)    Leukocytes, UA LARGE (*)    All other components within normal limits  URINE MICROSCOPIC-ADD ON - Abnormal; Notable for the following:    Squamous Epithelial / LPF MANY (*)    Bacteria, UA MANY (*)    All other components within normal  limits  CBC - Abnormal; Notable for the following:    RBC 3.62 (*)    Hemoglobin 9.1 (*)    HCT 29.1 (*)    MCH 25.1 (*)    RDW 16.7 (*)    All other components within normal limits  URINE CULTURE  GC/CHLAMYDIA PROBE AMP  PREGNANCY, URINE   No results found. 1. Vaginal bleeding   2. Urinary tract infection   3. Anemia     MDM  Pt presenting with vaginal bleeding.  Labs reassuring, anemia at her baseline.  Gc/chlamydia pending.  Pt with signs of UTI, nitrite positive.  Will start on antibiotics also recommended iron supplements.  Given information for OB/GYN followup.  Discharged with strict return precautions.  Pt agreeable with plan.  Ethelda Chick, MD 04/10/13 [phone]

## 2013-04-10 LAB — GC/CHLAMYDIA PROBE AMP: CT Probe RNA: NEGATIVE

## 2013-04-10 LAB — URINE CULTURE: Colony Count: 9000

## 2013-07-25 ENCOUNTER — Emergency Department (HOSPITAL_BASED_OUTPATIENT_CLINIC_OR_DEPARTMENT_OTHER)
Admission: EM | Admit: 2013-07-25 | Discharge: 2013-07-25 | Disposition: A | Discharge status: Home / Self Care | Payer: 59 | Attending: Emergency Medicine | Admitting: Emergency Medicine

## 2013-07-25 ENCOUNTER — Encounter (HOSPITAL_BASED_OUTPATIENT_CLINIC_OR_DEPARTMENT_OTHER): Payer: Self-pay | Admitting: Emergency Medicine

## 2013-07-25 DIAGNOSIS — J029 Acute pharyngitis, unspecified: Secondary | ICD-10-CM | POA: Insufficient documentation

## 2013-07-25 LAB — RAPID STREP SCREEN (MED CTR MEBANE ONLY): Streptococcus, Group A Screen (Direct): NEGATIVE

## 2013-07-25 NOTE — ED Notes (Signed)
Pt reports cough and sore throat x 2 days.  Reports white sputum.

## 2013-07-25 NOTE — ED Provider Notes (Signed)
CSN: [id number]     Arrival date & time 07/25/13  0112 History   First MD Initiated Contact with Patient 07/25/13 0203     Chief Complaint  Patient presents with  . Sore Throat   (Consider location/radiation/quality/duration/timing/severity/associated sxs/prior Treatment) HPI This is a 42 year old female who has had a cold for over a week. Her cold symptoms include nasal congestion and cough productive of white sputum. She's had no vomiting or diarrhea. She is not having a fever. She is here with a one-day history of a sore throat. She describes the sore throat is scratchiness. It is moderate in intensity at its worst. It is worse with swallowing. She has been taking over-the-counter remedies with some relief.  History reviewed. No pertinent past medical history. History reviewed. No pertinent past surgical history. History reviewed. No pertinent family history. History  Substance Use Topics  . Smoking status: Never Smoker   . Smokeless tobacco: Not on file  . Alcohol Use: No   OB History   Grav Para Term Preterm Abortions TAB SAB Ect Mult Living                 Review of Systems  All other systems reviewed and are negative.    Allergies  Review of patient's allergies indicates no known allergies.  Home Medications  No current outpatient prescriptions on file. BP 148/91  Pulse 88  Temp(Src) 98.1 F (36.7 C) (Oral)  Resp 18  SpO2 99%  Physical Exam General: Well-developed, well-nourished female in no acute distress; appearance consistent with age of record HENT: normocephalic; atraumatic; mucous membranes moist; mild pharyngeal erythema Eyes: pupils equal, round and reactive to light; extraocular muscles intact Neck: supple; no lymphadenopathy Heart: regular rate and rhythm Lungs: clear to auscultation bilaterally Abdomen: soft; nondistended; nontender Extremities: No deformity; full range of motion Neurologic: Awake, alert and oriented; motor function intact in all  extremities and symmetric; no facial droop Skin: Warm and dry Psychiatric: Normal mood and affect    ED Course  Procedures (including critical care time)  MDM      Hanley Seamen, MD 07/25/13 [phone]

## 2013-07-28 LAB — CULTURE, GROUP A STREP

## 2014-04-29 ENCOUNTER — Emergency Department (HOSPITAL_BASED_OUTPATIENT_CLINIC_OR_DEPARTMENT_OTHER)
Admission: EM | Admit: 2014-04-29 | Discharge: 2014-04-29 | Disposition: A | Discharge status: Home / Self Care | Payer: Managed Care, Other (non HMO) | Attending: Emergency Medicine | Admitting: Emergency Medicine

## 2014-04-29 ENCOUNTER — Encounter (HOSPITAL_BASED_OUTPATIENT_CLINIC_OR_DEPARTMENT_OTHER): Payer: Self-pay | Admitting: Emergency Medicine

## 2014-04-29 ENCOUNTER — Emergency Department (HOSPITAL_BASED_OUTPATIENT_CLINIC_OR_DEPARTMENT_OTHER): Payer: Managed Care, Other (non HMO)

## 2014-04-29 DIAGNOSIS — Z3202 Encounter for pregnancy test, result negative: Secondary | ICD-10-CM | POA: Insufficient documentation

## 2014-04-29 DIAGNOSIS — R110 Nausea: Secondary | ICD-10-CM | POA: Insufficient documentation

## 2014-04-29 DIAGNOSIS — N898 Other specified noninflammatory disorders of vagina: Secondary | ICD-10-CM | POA: Diagnosis present

## 2014-04-29 DIAGNOSIS — N949 Unspecified condition associated with female genital organs and menstrual cycle: Secondary | ICD-10-CM | POA: Diagnosis not present

## 2014-04-29 DIAGNOSIS — R102 Pelvic and perineal pain: Secondary | ICD-10-CM

## 2014-04-29 DIAGNOSIS — N939 Abnormal uterine and vaginal bleeding, unspecified: Secondary | ICD-10-CM

## 2014-04-29 LAB — BASIC METABOLIC PANEL
Anion gap: 10 (ref 5–15)
BUN: 11 mg/dL (ref 6–23)
CALCIUM: 9.7 mg/dL (ref 8.4–10.5)
CO2: 25 mEq/L (ref 19–32)
CREATININE: 1 mg/dL (ref 0.50–1.10)
Chloride: 104 mEq/L (ref 96–112)
GFR calc Af Amer: 78 mL/min — ABNORMAL LOW (ref >90)
GFR, EST NON AFRICAN AMERICAN: 67 mL/min — AB (ref >90)
Glucose, Bld: 117 mg/dL — ABNORMAL HIGH (ref 70–99)
Potassium: 3.9 mEq/L (ref 3.7–5.3)
Sodium: 139 mEq/L (ref 137–147)

## 2014-04-29 LAB — URINALYSIS, ROUTINE W REFLEX MICROSCOPIC
Bilirubin Urine: NEGATIVE
GLUCOSE, UA: NEGATIVE mg/dL
Ketones, ur: NEGATIVE mg/dL
Nitrite: NEGATIVE
PH: 6 (ref 5.0–8.0)
Protein, ur: >300 mg/dL — AB
SPECIFIC GRAVITY, URINE: 1.028 (ref 1.005–1.030)
Urobilinogen, UA: 1 mg/dL (ref 0.0–1.0)

## 2014-04-29 LAB — CBC WITH DIFFERENTIAL/PLATELET
BASOS PCT: 0 % (ref 0–1)
Basophils Absolute: 0 10*3/uL (ref 0.0–0.1)
EOS ABS: 0.1 10*3/uL (ref 0.0–0.7)
EOS PCT: 1 % (ref 0–5)
HEMATOCRIT: 28.7 % — AB (ref 36.0–46.0)
HEMOGLOBIN: 8.8 g/dL — AB (ref 12.0–15.0)
Lymphocytes Relative: 34 % (ref 12–46)
Lymphs Abs: 2.3 10*3/uL (ref 0.7–4.0)
MCH: 23.7 pg — AB (ref 26.0–34.0)
MCHC: 30.7 g/dL (ref 30.0–36.0)
MCV: 77.2 fL — ABNORMAL LOW (ref 78.0–100.0)
MONO ABS: 0.3 10*3/uL (ref 0.1–1.0)
MONOS PCT: 4 % (ref 3–12)
NEUTROS PCT: 61 % (ref 43–77)
Neutro Abs: 4 10*3/uL (ref 1.7–7.7)
Platelets: 410 10*3/uL — ABNORMAL HIGH (ref 150–400)
RBC: 3.72 MIL/uL — ABNORMAL LOW (ref 3.87–5.11)
RDW: 17.3 % — ABNORMAL HIGH (ref 11.5–15.5)
WBC: 6.7 10*3/uL (ref 4.0–10.5)

## 2014-04-29 LAB — WET PREP, GENITAL
Trich, Wet Prep: NONE SEEN
WBC, Wet Prep HPF POC: NONE SEEN
Yeast Wet Prep HPF POC: NONE SEEN

## 2014-04-29 LAB — PREGNANCY, URINE: Preg Test, Ur: NEGATIVE

## 2014-04-29 LAB — URINE MICROSCOPIC-ADD ON

## 2014-04-29 MED ORDER — MORPHINE SULFATE 4 MG/ML IJ SOLN
4.0000 mg | Freq: Once | INTRAMUSCULAR | Status: AC
  Administered 2014-04-29: 4 mg via INTRAVENOUS
  Filled 2014-04-29: qty 1

## 2014-04-29 MED ORDER — ONDANSETRON HCL 4 MG/2ML IJ SOLN
4.0000 mg | Freq: Once | INTRAMUSCULAR | Status: AC
  Administered 2014-04-29: 4 mg via INTRAVENOUS
  Filled 2014-04-29: qty 2

## 2014-04-29 MED ORDER — HYDROMORPHONE HCL PF 1 MG/ML IJ SOLN
1.0000 mg | Freq: Once | INTRAMUSCULAR | Status: AC
  Administered 2014-04-29: 1 mg via INTRAVENOUS
  Filled 2014-04-29: qty 1

## 2014-04-29 MED ORDER — ONDANSETRON HCL 4 MG PO TABS: 4.0000 mg | ORAL_TABLET | Freq: Three times a day (TID) | ORAL | Status: DC | PRN

## 2014-04-29 MED ORDER — OXYCODONE-ACETAMINOPHEN 5-325 MG PO TABS: 1.0000 | ORAL_TABLET | Freq: Four times a day (QID) | ORAL | Status: DC | PRN

## 2014-04-29 NOTE — Discharge Instructions (Signed)
Menorrhagia  Menorrhagia is the medical term for when your menstrual periods are heavy or last longer than usual. With menorrhagia, every period you have may cause enough blood loss and cramping that you are unable to maintain your usual activities.  CAUSES   In some cases, the cause of heavy periods is unknown, but a number of conditions may cause menorrhagia. Common causes include:   A problem with the hormone-producing thyroid gland (hypothyroid).   Noncancerous growths in the uterus (polyps or fibroids).   An imbalance of the estrogen and progesterone hormones.   One of your ovaries not releasing an egg during one or more months.   Side effects of having an intrauterine device (IUD).   Side effects of some medicines, such as anti-inflammatory medicines or blood thinners.   A bleeding disorder that stops your blood from clotting normally.  SIGNS AND SYMPTOMS   During a normal period, bleeding lasts between 4 and 8 days. Signs that your periods are too heavy include:   You routinely have to change your pad or tampon every 1 or 2 hours because it is completely soaked.   You pass blood clots larger than 1 inch (2.5 cm) in size.   You have bleeding for more than 7 days.   You need to use pads and tampons at the same time because of heavy bleeding.   You need to wake up to change your pads or tampons during the night.   You have symptoms of anemia, such as tiredness, fatigue, or shortness of breath.  DIAGNOSIS   Your health care provider will perform a physical exam and ask you questions about your symptoms and menstrual history. Other tests may be ordered based on what the health care provider finds during the exam. These tests can include:   Blood tests. Blood tests are used to check if you are pregnant or have hormonal changes, a bleeding or thyroid disorder, low iron levels (anemia), or other problems.   Endometrial biopsy. Your health care provider takes a sample of tissue from the inside of your  uterus to be examined under a microscope.   Pelvic ultrasound. This test uses sound waves to make a picture of your uterus, ovaries, and vagina. The pictures can show if you have fibroids or other growths.   Hysteroscopy. For this test, your health care provider will use a small telescope to look inside your uterus.  Based on the results of your initial tests, your health care provider may recommend further testing.  TREATMENT   Treatment may not be needed. If it is needed, your health care provider may recommend treatment with one or more medicines first. If these do not reduce bleeding enough, a surgical treatment might be an option. The best treatment for you will depend on:    Whether you need to prevent pregnancy.   Your desire to have children in the future.   The cause and severity of your bleeding.   Your opinion and personal preference.   Medicines for menorrhagia may include:   Birth control methods that use hormones. These include the pill, skin patch, vaginal ring, shots that you get every 3 months, hormonal IUD, and implant. These treatments reduce bleeding during your menstrual period.   Medicines that thicken blood and slow bleeding.   Medicines that reduce swelling, such as ibuprofen.   Medicines that contain a synthetic hormone called progestin.    Medicines that make the ovaries stop working for a short time.     the lining of your uterus to reduce menstrual bleeding.  Operative hysteroscopy. This procedure uses a tiny tube with a light (hysteroscope) to view your uterine cavity and can help in the surgical removal of a polyp that may be causing heavy periods.  Endometrial ablation. Through various techniques, your health care  provider permanently destroys the entire lining of your uterus (endometrium). After endometrial ablation, most women have little or no menstrual flow. Endometrial ablation reduces your ability to become pregnant.  Endometrial resection. This surgical procedure uses an electrosurgical wire loop to remove the lining of the uterus. This procedure also reduces your ability to become pregnant.  Hysterectomy. Surgical removal of the uterus and cervix is a permanent procedure that stops menstrual periods. Pregnancy is not possible after a hysterectomy. This procedure requires anesthesia and hospitalization. HOME CARE INSTRUCTIONS   Only take over-the-counter or prescription medicines as directed by your health care provider. Take prescribed medicines exactly as directed. Do not change or switch medicines without consulting your health care provider.  Take any prescribed iron pills exactly as directed by your health care provider. Long-term heavy bleeding may result in low iron levels. Iron pills help replace the iron your body lost from heavy bleeding. Iron may cause constipation. If this becomes a problem, increase the bran, fruits, and roughage in your diet.  Do not take aspirin or medicines that contain aspirin 1 week before or during your menstrual period. Aspirin may make the bleeding worse.  If you need to change your sanitary pad or tampon more than once every 2 hours, stay in bed and rest as much as possible until the bleeding stops.  Eat well-balanced meals. Eat foods high in iron. Examples are leafy green vegetables, meat, liver, eggs, and whole grain breads and cereals. Do not try to lose weight until the abnormal bleeding has stopped and your blood iron level is back to normal. SEEK MEDICAL CARE IF:   You soak through a pad or tampon every 1 or 2 hours, and this happens every time you have a period.  You need to use pads and tampons at the same time because you are bleeding so much.  You  need to change your pad or tampon during the night.  You have a period that lasts for more than 8 days.  You pass clots bigger than 1 inch wide.  You have irregular periods that happen more or less often than once a month.  You feel dizzy or faint.  You feel very weak or tired.  You feel short of breath or feel your heart is beating too fast when you exercise.  You have nausea and vomiting or diarrhea while you are taking your medicine.  You have any problems that may be related to the medicine you are taking. SEEK IMMEDIATE MEDICAL CARE IF:   You soak through 4 or more pads or tampons in 2 hours.  You have any bleeding while you are pregnant. MAKE SURE YOU:   Understand these instructions.  Will watch your condition.  Will get help right away if you are not doing well or get worse. Document Released: 08/06/2005 Document Revised: 08/11/2013 Document Reviewed: 01/25/2013 [employer] Patient Information 2015 [employer]. This information is not intended to replace advice given to you by your health care provider. Make sure you discuss any questions you have with your health care provider. Pelvic Pain Female pelvic pain can be caused by many different things and start from a variety of places. Pelvic pain   refers to pain that is located in the lower half of the abdomen and between your hips. The pain may occur over a short period of time (acute) or may be reoccurring (chronic). The cause of pelvic pain may be related to disorders affecting the female reproductive organs (gynecologic), but it may also be related to the bladder, kidney stones, an intestinal complication, or muscle or skeletal problems. Getting help right away for pelvic pain is important, especially if there has been severe, sharp, or a sudden onset of unusual pain. It is also important to get help right away because some types of pelvic pain can be life threatening.  CAUSES  Below are only some of the causes of pelvic  pain. The causes of pelvic pain can be in one of several categories.   Gynecologic.  Pelvic inflammatory disease.  Sexually transmitted infection.  Ovarian cyst or a twisted ovarian ligament (ovarian torsion).  Uterine lining that grows outside the uterus (endometriosis).  Fibroids, cysts, or tumors.  Ovulation.  Pregnancy.  Pregnancy that occurs outside the uterus (ectopic pregnancy).  Miscarriage.  Labor.  Abruption of the placenta or ruptured uterus.  Infection.  Uterine infection (endometritis).  Bladder infection.  Diverticulitis.  Miscarriage related to a uterine infection (septic abortion).  Bladder.  Inflammation of the bladder (cystitis).  Kidney stone(s).  Gastrointestinal.  Constipation.  Diverticulitis.  Neurologic.  Trauma.  Feeling pelvic pain because of mental or emotional causes (psychosomatic).  Cancers of the bowel or pelvis. EVALUATION  Your caregiver will want to take a careful history of your concerns. This includes recent changes in your health, a careful gynecologic history of your periods (menses), and a sexual history. Obtaining your family history and medical history is also important. Your caregiver may suggest a pelvic exam. A pelvic exam will help identify the location and severity of the pain. It also helps in the evaluation of which organ system may be involved. In order to identify the cause of the pelvic pain and be properly treated, your caregiver may order tests. These tests may include:   A pregnancy test.  Pelvic ultrasonography.  An X-ray exam of the abdomen.  A urinalysis or evaluation of vaginal discharge.  Blood tests. HOME CARE INSTRUCTIONS   Only take over-the-counter or prescription medicines for pain, discomfort, or fever as directed by your caregiver.   Rest as directed by your caregiver.   Eat a balanced diet.   Drink enough fluids to make your urine clear or pale yellow, or as directed.    Avoid sexual intercourse if it causes pain.   Apply warm or cold compresses to the lower abdomen depending on which one helps the pain.   Avoid stressful situations.   Keep a journal of your pelvic pain. Write down when it started, where the pain is located, and if there are things that seem to be associated with the pain, such as food or your menstrual cycle.  Follow up with your caregiver as directed.  SEEK MEDICAL CARE IF:  Your medicine does not help your pain.  You have abnormal vaginal discharge. SEEK IMMEDIATE MEDICAL CARE IF:   You have heavy bleeding from the vagina.   Your pelvic pain increases.   You feel light-headed or faint.   You have chills.   You have pain with urination or blood in your urine.   You have uncontrolled diarrhea or vomiting.   You have a fever or persistent symptoms for more than 3 days.  You have a   You have a fever and your symptoms suddenly get worse.   You are being physically or sexually abused.  MAKE SURE YOU:  Understand these instructions.  Will watch your condition.  Will get help if you are not doing well or get worse. Document Released: 07/03/2004 Document Revised: 12/21/2013 Document Reviewed: 11/26/2011 [facility] Patient Information 2015 [place], [place]. This information is not intended to replace advice given to you by your health care provider. Make sure you discuss any questions you have with your health care provider.

## 2014-04-29 NOTE — ED Provider Notes (Signed)
Medical screening examination/treatment/procedure(s) were performed by non-physician practitioner and as supervising physician I was immediately available for consultation/collaboration.   EKG Interpretation None        Evelina Bucy, MD 04/29/14 2349

## 2014-04-29 NOTE — ED Provider Notes (Signed)
CSN: [id number]     Arrival date & time 04/29/14  1526 History   First MD Initiated Contact with Patient 04/29/14 1736     Chief Complaint  Patient presents with  . Vaginal Bleeding     (Consider location/radiation/quality/duration/timing/severity/associated sxs/prior Treatment) HPI Yesenia Mills is a 43 year old female with no past medical history experiencing heavier than normal bleeding. Patient states she just started her menses yesterday, and has noticed a pain in her right lower quadrant which is relieved after she passes a clot, then continues to return. Patient states she went through an entire box of pads today which typically lasts her her entire menses approximately 3-5 days. Patient states her pain is a sharp, throbbing pain which does not radiate. She states when it is present it is a 10 out of 10, and states she has some associated mild nausea. She denies any associated vomiting, fever, diarrhea, dysuria, vaginal pain, vaginal discharge, chest pain, shortness of breath, weakness, dizziness. History reviewed. No pertinent past medical history. History reviewed. No pertinent past surgical history. No family history on file. History  Substance Use Topics  . Smoking status: Never Smoker   . Smokeless tobacco: Not on file  . Alcohol Use: No   OB History   Grav Para Term Preterm Abortions TAB SAB Ect Mult Living                 Review of Systems  Constitutional: Negative for fever.  HENT: Negative for trouble swallowing.   Eyes: Negative for visual disturbance.  Respiratory: Negative for shortness of breath.   Cardiovascular: Negative for chest pain.  Gastrointestinal: Positive for nausea. Negative for vomiting, abdominal pain and diarrhea.  Genitourinary: Positive for vaginal bleeding, menstrual problem and pelvic pain. Negative for dysuria, frequency, hematuria, flank pain, vaginal discharge and vaginal pain.  Musculoskeletal: Negative for neck pain.  Skin: Negative for  rash.  Neurological: Negative for dizziness, weakness, light-headedness and numbness.  Psychiatric/Behavioral: Negative.       Allergies  Review of patient's allergies indicates no known allergies.  Home Medications   Prior to Admission medications   Medication Sig Start Date End Date Taking? Authorizing Provider  ondansetron (ZOFRAN) 4 MG tablet Take 1 tablet (4 mg total) by mouth every 8 (eight) hours as needed for nausea or vomiting. 04/29/14   Carrie Mew, PA-C  oxyCODONE-acetaminophen (PERCOCET) 5-325 MG per tablet Take 1-2 tablets by mouth every 6 (six) hours as needed. 04/29/14   Carrie Mew, PA-C   BP 152/85  Pulse 63  Temp(Src) 98 F (36.7 C) (Oral)  Resp 16  Ht 5' (1.524 m)  Wt 171 lb (77.565 kg)  BMI 33.40 kg/m2  SpO2 100%  LMP 04/28/2014 Physical Exam  Nursing note and vitals reviewed. Constitutional: She is oriented to person, place, and time. She appears well-developed and well-nourished. No distress.  HENT:  Head: Normocephalic and atraumatic.  Mouth/Throat: Oropharynx is clear and moist. No oropharyngeal exudate.  Eyes: Right eye exhibits no discharge. Left eye exhibits no discharge. No scleral icterus.  Neck: Normal range of motion.  Cardiovascular: Normal rate, regular rhythm and normal heart sounds.   No murmur heard. Pulmonary/Chest: Effort normal and breath sounds normal. No respiratory distress.  Abdominal: Soft. There is tenderness in the right lower quadrant. There is no rigidity, no guarding, no tenderness at McBurney's point and negative Murphy's sign.  Genitourinary: Pelvic exam was performed with patient supine. There is no rash, tenderness, lesion or injury on the right labia.  There is no rash, tenderness, lesion or injury on the left labia. Uterus is not tender. Cervix exhibits no motion tenderness, no discharge and no friability. Right adnexum displays tenderness. Right adnexum displays no mass and no fullness. Left adnexum displays no mass,  no tenderness and no fullness. There is bleeding around the vagina. No erythema or tenderness around the vagina. No foreign body around the vagina. No signs of injury around the vagina. No vaginal discharge found.  Patient's pain reproducible in the right adnexa. No cervical motion tenderness. Mild to moderate amount of blood in vaginal vault. Chaperone present during entire pelvic exam.  Musculoskeletal: Normal range of motion. She exhibits no edema and no tenderness.  Neurological: She is alert and oriented to person, place, and time. No cranial nerve deficit. Coordination normal.  Skin: Skin is warm and dry. No rash noted. She is not diaphoretic.  Psychiatric: She has a normal mood and affect.    ED Course  Procedures (including critical care time) Labs Review Labs Reviewed  WET PREP, GENITAL - Abnormal; Notable for the following:    Clue Cells Wet Prep HPF POC FEW (*)    All other components within normal limits  URINALYSIS, ROUTINE W REFLEX MICROSCOPIC - Abnormal; Notable for the following:    Color, Urine RED (*)    APPearance TURBID (*)    Hgb urine dipstick LARGE (*)    Protein, ur >300 (*)    Leukocytes, UA SMALL (*)    All other components within normal limits  URINE MICROSCOPIC-ADD ON - Abnormal; Notable for the following:    Squamous Epithelial / LPF FEW (*)    Bacteria, UA FEW (*)    All other components within normal limits  CBC WITH DIFFERENTIAL - Abnormal; Notable for the following:    RBC 3.72 (*)    Hemoglobin 8.8 (*)    HCT 28.7 (*)    MCV 77.2 (*)    MCH 23.7 (*)    RDW 17.3 (*)    Platelets 410 (*)    All other components within normal limits  BASIC METABOLIC PANEL - Abnormal; Notable for the following:    Glucose, Bld 117 (*)    GFR calc non Af Amer 67 (*)    GFR calc Af Amer 78 (*)    All other components within normal limits  GC/CHLAMYDIA PROBE AMP  PREGNANCY, URINE    Imaging Review US Transvaginal Non-ob  04/29/2014   CLINICAL DATA:  Heavy  vaginal bleeding  EXAM: TRANSABDOMINAL AND TRANSVAGINAL ULTRASOUND OF PELVIS  TECHNIQUE: Both transabdominal and transvaginal ultrasound examinations of the pelvis were performed. Transabdominal technique was performed for global imaging of the pelvis including uterus, ovaries, adnexal regions, and pelvic cul-de-sac. It was necessary to proceed with endovaginal exam following the transabdominal exam to visualize the endometrium.  COMPARISON:  None  FINDINGS: Uterus  Measurements: 10.8 x 4.8 x 6.1 cm. Diffuse heterogeneity is noted. No discrete fibroid is seen although diffuse myomatous infiltration could not be totally excluded.  Endometrium  Thickness: 9 mm.  No focal abnormality visualized.  Right ovary  Measurements: 4 x 2.2 x 3.1 cm. Normal appearance/no adnexal mass.  Left ovary  Measurements: 3.2 x 2.0 x 2.4 cm. Normal appearance/no adnexal mass.  Other findings  No free fluid.  IMPRESSION: Diffuse heterogeneity of the uterus which may represent some infiltrating myomatous change. No discrete fibroid is noted.  No other focal abnormality is seen.   Electronically Signed   By: Inez Catalina  M.D.   On: 04/29/2014 21:39   US Pelvis Complete  04/29/2014   CLINICAL DATA:  Heavy vaginal bleeding  EXAM: TRANSABDOMINAL AND TRANSVAGINAL ULTRASOUND OF PELVIS  TECHNIQUE: Both transabdominal and transvaginal ultrasound examinations of the pelvis were performed. Transabdominal technique was performed for global imaging of the pelvis including uterus, ovaries, adnexal regions, and pelvic cul-de-sac. It was necessary to proceed with endovaginal exam following the transabdominal exam to visualize the endometrium.  COMPARISON:  None  FINDINGS: Uterus  Measurements: 10.8 x 4.8 x 6.1 cm. Diffuse heterogeneity is noted. No discrete fibroid is seen although diffuse myomatous infiltration could not be totally excluded.  Endometrium  Thickness: 9 mm.  No focal abnormality visualized.  Right ovary  Measurements: 4 x 2.2 x 3.1 cm.  Normal appearance/no adnexal mass.  Left ovary  Measurements: 3.2 x 2.0 x 2.4 cm. Normal appearance/no adnexal mass.  Other findings  No free fluid.  IMPRESSION: Diffuse heterogeneity of the uterus which may represent some infiltrating myomatous change. No discrete fibroid is noted.  No other focal abnormality is seen.   Electronically Signed   By: Inez Catalina M.D.   On: 04/29/2014 21:39     EKG Interpretation None      MDM   Final diagnoses:  Pelvic pain in female  Vaginal bleeding    43 year old female with no past medical history experiencing heavier than normal bleeding. Patient states she just started her menses yesterday, and has noticed a pain in her right lower quadrant which is relieved after she passes a clot, then continues to return. Patient states she went through an entire box of pads today which typically lasts her her entire menses approximately 3-5 days. Right lower quadrant tenderness noted on exam. Workup to rule out ovarian torsion. Urine pregnancy negative.    4:20 PM: UA remarkable for possible contaminated catch, consistent with vaginal bleeding. Patient still denying any dysuria symptoms or vaginal pain, discomfort.  9:39 PM: Pelvic ultrasound returns with impression of diffuse heterogenicity of the uterus which may represent some infiltrating myomatous change. No discrete fibroid noted. No ovarian or fallopian tube torsion noted. Patient has mild anemia at 8.8, which is down from 9.1 and 9.2 last year. This appears to be baseline for her. No leukocytosis noted, no fever in the ED, no tachycardia, no signs of surgical abdomen at this time.  We will discharge patient at this time and have her followup with her OB/GYN based on the impressions of her pelvic ultrasound. Patient's pain is 0/10 at this time. We'll give her pain medication and Zofran to go home with. Patient is nontoxic, nonseptic appearing, in no apparent distress.  Patient's pain and other symptoms  adequately managed in emergency department.  Fluid bolus given.  Labs, imaging and vitals reviewed.  Patient does not meet the SIRS or Sepsis criteria.  On repeat exam patient does not have a surgical abdomin and there are no peritoneal signs.  No indication of appendicitis, bowel obstruction, bowel perforation, cholecystitis, diverticulitis, PID or ectopic pregnancy based on pelvic ultrasound.  Patient discharged home with symptomatic treatment and given strict instructions for follow-up with their OB/GYN.  I have also discussed reasons to return immediately to the ER.  Patient expresses understanding and agrees with plan.   BP 152/85  Pulse 63  Temp(Src) 98 F (36.7 C) (Oral)  Resp 16  Ht 5' (1.524 m)  Wt 171 lb (77.565 kg)  BMI 33.40 kg/m2  SpO2 100%  LMP 04/28/2014   Signed,  Dahlia Bailiff, PA-C 11:09 PM   This patient seen with Dr. Evelina Bucy, MD        Carrie Mew, PA-C 04/29/14 [phone]

## 2014-04-29 NOTE — ED Notes (Signed)
MD at bedside. 

## 2014-04-29 NOTE — ED Notes (Signed)
Patient transported to Ultrasound 

## 2014-04-29 NOTE — ED Notes (Signed)
Vaginal bleeding and abdominal cramping since last night. May be her menses but heavier with clots per pt.

## 2014-04-30 LAB — GC/CHLAMYDIA PROBE AMP
CT Probe RNA: NEGATIVE
GC Probe RNA: NEGATIVE

## 2015-07-18 ENCOUNTER — Encounter (HOSPITAL_BASED_OUTPATIENT_CLINIC_OR_DEPARTMENT_OTHER): Payer: Self-pay

## 2015-07-18 ENCOUNTER — Emergency Department (HOSPITAL_BASED_OUTPATIENT_CLINIC_OR_DEPARTMENT_OTHER)
Admission: EM | Admit: 2015-07-18 | Discharge: 2015-07-18 | Disposition: A | Discharge status: Home / Self Care | Payer: 59 | Attending: Emergency Medicine | Admitting: Emergency Medicine

## 2015-07-18 ENCOUNTER — Emergency Department (HOSPITAL_BASED_OUTPATIENT_CLINIC_OR_DEPARTMENT_OTHER): Payer: 59

## 2015-07-18 DIAGNOSIS — Z3202 Encounter for pregnancy test, result negative: Secondary | ICD-10-CM | POA: Insufficient documentation

## 2015-07-18 DIAGNOSIS — N281 Cyst of kidney, acquired: Secondary | ICD-10-CM | POA: Diagnosis not present

## 2015-07-18 DIAGNOSIS — K2970 Gastritis, unspecified, without bleeding: Secondary | ICD-10-CM | POA: Diagnosis not present

## 2015-07-18 DIAGNOSIS — R1013 Epigastric pain: Secondary | ICD-10-CM

## 2015-07-18 LAB — URINALYSIS, ROUTINE W REFLEX MICROSCOPIC
Bilirubin Urine: NEGATIVE
GLUCOSE, UA: NEGATIVE mg/dL
KETONES UR: NEGATIVE mg/dL
NITRITE: NEGATIVE
Protein, ur: 30 mg/dL — AB
Specific Gravity, Urine: 1.014 (ref 1.005–1.030)
pH: 7 (ref 5.0–8.0)

## 2015-07-18 LAB — URINE MICROSCOPIC-ADD ON

## 2015-07-18 LAB — COMPREHENSIVE METABOLIC PANEL
ALBUMIN: 3.8 g/dL (ref 3.5–5.0)
ALK PHOS: 78 U/L (ref 38–126)
ALT: 12 U/L — AB (ref 14–54)
AST: 14 U/L — AB (ref 15–41)
Anion gap: 9 (ref 5–15)
BUN: 9 mg/dL (ref 6–20)
CHLORIDE: 104 mmol/L (ref 101–111)
CO2: 25 mmol/L (ref 22–32)
CREATININE: 0.82 mg/dL (ref 0.44–1.00)
Calcium: 9.5 mg/dL (ref 8.9–10.3)
GFR calc non Af Amer: >60 mL/min (ref >60)
GLUCOSE: 103 mg/dL — AB (ref 65–99)
Potassium: 4.1 mmol/L (ref 3.5–5.1)
SODIUM: 138 mmol/L (ref 135–145)
Total Bilirubin: 0.5 mg/dL (ref 0.3–1.2)
Total Protein: 7.8 g/dL (ref 6.5–8.1)

## 2015-07-18 LAB — CBC WITH DIFFERENTIAL/PLATELET
BASOS ABS: 0 10*3/uL (ref 0.0–0.1)
Basophils Relative: 0 %
Eosinophils Absolute: 0.1 10*3/uL (ref 0.0–0.7)
Eosinophils Relative: 1 %
HCT: 29.9 % — ABNORMAL LOW (ref 36.0–46.0)
HEMOGLOBIN: 8.9 g/dL — AB (ref 12.0–15.0)
LYMPHS ABS: 1.9 10*3/uL (ref 0.7–4.0)
LYMPHS PCT: 36 %
MCH: 22.8 pg — AB (ref 26.0–34.0)
MCHC: 29.8 g/dL — ABNORMAL LOW (ref 30.0–36.0)
MCV: 76.5 fL — ABNORMAL LOW (ref 78.0–100.0)
Monocytes Absolute: 0.3 10*3/uL (ref 0.1–1.0)
Monocytes Relative: 6 %
NEUTROS PCT: 57 %
Neutro Abs: 3 10*3/uL (ref 1.7–7.7)
Platelets: 425 10*3/uL — ABNORMAL HIGH (ref 150–400)
RBC: 3.91 MIL/uL (ref 3.87–5.11)
RDW: 17.5 % — ABNORMAL HIGH (ref 11.5–15.5)
WBC: 5.3 10*3/uL (ref 4.0–10.5)

## 2015-07-18 LAB — LIPASE, BLOOD: Lipase: 32 U/L (ref 11–51)

## 2015-07-18 LAB — PREGNANCY, URINE: PREG TEST UR: NEGATIVE

## 2015-07-18 MED ORDER — ONDANSETRON HCL 4 MG PO TABS: 4.0000 mg | ORAL_TABLET | Freq: Four times a day (QID) | ORAL | Status: DC

## 2015-07-18 MED ORDER — GI COCKTAIL ~~LOC~~
30.0000 mL | Freq: Once | ORAL | Status: AC
  Administered 2015-07-18: 30 mL via ORAL
  Filled 2015-07-18: qty 30

## 2015-07-18 MED ORDER — SODIUM CHLORIDE 0.9 % IV BOLUS (SEPSIS)
1000.0000 mL | Freq: Once | INTRAVENOUS | Status: AC
  Administered 2015-07-18: 1000 mL via INTRAVENOUS

## 2015-07-18 MED ORDER — MORPHINE SULFATE (PF) 4 MG/ML IV SOLN
8.0000 mg | Freq: Once | INTRAVENOUS | Status: AC
  Administered 2015-07-18: 8 mg via INTRAVENOUS
  Filled 2015-07-18: qty 2

## 2015-07-18 MED ORDER — IOHEXOL 300 MG/ML  SOLN
30.0000 mL | Freq: Once | INTRAMUSCULAR | Status: DC | PRN
  Administered 2015-07-18 (×2): 30 mL via INTRAVENOUS

## 2015-07-18 MED ORDER — MORPHINE SULFATE (PF) 4 MG/ML IV SOLN
4.0000 mg | Freq: Once | INTRAVENOUS | Status: AC
  Administered 2015-07-18: 4 mg via INTRAVENOUS
  Filled 2015-07-18: qty 1

## 2015-07-18 MED ORDER — KETOROLAC TROMETHAMINE 30 MG/ML IJ SOLN
30.0000 mg | Freq: Once | INTRAMUSCULAR | Status: AC
  Administered 2015-07-18: 30 mg via INTRAVENOUS
  Filled 2015-07-18: qty 1

## 2015-07-18 MED ORDER — ONDANSETRON HCL 4 MG/2ML IJ SOLN
4.0000 mg | Freq: Once | INTRAMUSCULAR | Status: AC
  Administered 2015-07-18: 4 mg via INTRAVENOUS
  Filled 2015-07-18: qty 2

## 2015-07-18 NOTE — Discharge Instructions (Signed)
Takes Zantac 150 mg twice a day. Follow with your doctor in a few days for recheck. You were found to have a complex cyst on your left kidney, the radiologist recommends that you get a repeat ultrasound in 6 months or a renal MRI. Gastritis, Adult Gastritis is soreness and swelling (inflammation) of the lining of the stomach. Gastritis can develop as a sudden onset (acute) or long-term (chronic) condition. If gastritis is not treated, it can lead to stomach bleeding and ulcers. CAUSES  Gastritis occurs when the stomach lining is weak or damaged. Digestive juices from the stomach then inflame the weakened stomach lining. The stomach lining may be weak or damaged due to viral or bacterial infections. One common bacterial infection is the Helicobacter pylori infection. Gastritis can also result from excessive alcohol consumption, taking certain medicines, or having too much acid in the stomach.  SYMPTOMS  In some cases, there are no symptoms. When symptoms are present, they may include:  Pain or a burning sensation in the upper abdomen.  Nausea.  Vomiting.  An uncomfortable feeling of fullness after eating. DIAGNOSIS  Your caregiver may suspect you have gastritis based on your symptoms and a physical exam. To determine the cause of your gastritis, your caregiver may perform the following:  Blood or stool tests to check for the H pylori bacterium.  Gastroscopy. A thin, flexible tube (endoscope) is passed down the esophagus and into the stomach. The endoscope has a light and camera on the end. Your caregiver uses the endoscope to view the inside of the stomach.  Taking a tissue sample (biopsy) from the stomach to examine under a microscope. TREATMENT  Depending on the cause of your gastritis, medicines may be prescribed. If you have a bacterial infection, such as an H pylori infection, antibiotics may be given. If your gastritis is caused by too much acid in the stomach, H2 blockers or antacids  may be given. Your caregiver may recommend that you stop taking aspirin, ibuprofen, or other nonsteroidal anti-inflammatory drugs (NSAIDs). HOME CARE INSTRUCTIONS  Only take over-the-counter or prescription medicines as directed by your caregiver.  If you were given antibiotic medicines, take them as directed. Finish them even if you start to feel better.  Drink enough fluids to keep your urine clear or pale yellow.  Avoid foods and drinks that make your symptoms worse, such as:  Caffeine or alcoholic drinks.  Chocolate.  Peppermint or mint flavorings.  Garlic and onions.  Spicy foods.  Citrus fruits, such as oranges, lemons, or limes.  Tomato-based foods such as sauce, chili, salsa, and pizza.  Fried and fatty foods.  Eat small, frequent meals instead of large meals. SEEK IMMEDIATE MEDICAL CARE IF:   You have black or dark red stools.  You vomit blood or material that looks like coffee grounds.  You are unable to keep fluids down.  Your abdominal pain gets worse.  You have a fever.  You do not feel better after 1 week.  You have any other questions or concerns. MAKE SURE YOU:  Understand these instructions.  Will watch your condition.  Will get help right away if you are not doing well or get worse.   This information is not intended to replace advice given to you by your health care provider. Make sure you discuss any questions you have with your health care provider.   Document Released: 07/31/2001 Document Revised: 02/05/2012 Document Reviewed: 09/19/2011 Elsevier Interactive Patient Education [employer].

## 2015-07-18 NOTE — ED Provider Notes (Signed)
CSN: [id number]     Arrival date & time 07/18/15  0802 History   First MD Initiated Contact with Patient 07/18/15 [phone]     Chief Complaint  Patient presents with  . Abdominal Pain     (Consider location/radiation/quality/duration/timing/severity/associated sxs/prior Treatment) Patient is a 44 y.o. female presenting with abdominal pain. The history is provided by the patient.  Abdominal Pain Pain location:  Epigastric Pain quality: cramping, sharp and shooting   Pain radiates to:  Does not radiate Pain severity:  Severe Onset quality:  Sudden Duration:  7 hours Timing:  Intermittent Progression:  Waxing and waning Chronicity:  New Relieved by:  Nothing Worsened by:  Nothing tried Ineffective treatments:  None tried Associated symptoms: nausea   Associated symptoms: no chest pain, no chills, no constipation, no cough, no diarrhea, no dysuria, no fever, no shortness of breath and no vomiting   Risk factors: has not had multiple surgeries and not pregnant    44 yo F with a chief complaint of epigastric abdominal pain. This started this morning. Patient said she had some generalized abdominal cramping and felt like her normal menstrual cycle was starting. However she had trouble going back to sleep and had episodes every 5 minutes or she would have severe stabbing epigastric and left upper quadrant pain that last for a few minutes. She would lay back and relax and then would suddenly improved. Patient tried to go to work but had recurrent episodes of similar symptoms. Not sure what brings it on. Feels like it's occurring every 5 minutes. Denies similar pains previously. Denies fevers or chills. Has some nausea but denies vomiting. Did not have a bowel movement this morning but had a normal one yesterday. Denies chance of being pregnant.  History reviewed. No pertinent past medical history. History reviewed. No pertinent past surgical history. No family history on file. Social History   Substance Use Topics  . Smoking status: Never Smoker   . Smokeless tobacco: None  . Alcohol Use: No   OB History    No data available     Review of Systems  Constitutional: Negative for fever and chills.  HENT: Negative for congestion and rhinorrhea.   Eyes: Negative for redness and visual disturbance.  Respiratory: Negative for cough, shortness of breath and wheezing.   Cardiovascular: Negative for chest pain and palpitations.  Gastrointestinal: Positive for nausea and abdominal pain. Negative for vomiting, diarrhea and constipation.  Genitourinary: Negative for dysuria and urgency.  Musculoskeletal: Negative for myalgias and arthralgias.  Skin: Negative for pallor and wound.  Neurological: Negative for dizziness and headaches.      Allergies  Review of patient's allergies indicates no known allergies.  Home Medications   Prior to Admission medications   Medication Sig Start Date End Date Taking? Authorizing Provider  ondansetron (ZOFRAN) 4 MG tablet Take 1 tablet (4 mg total) by mouth every 6 (six) hours. 07/18/15   Deno Etienne, DO  oxyCODONE-acetaminophen (PERCOCET) 5-325 MG per tablet Take 1-2 tablets by mouth every 6 (six) hours as needed. 04/29/14   Dahlia Bailiff, PA-C   BP 137/75 mmHg  Pulse 53  Temp(Src) 98.1 F (36.7 C) (Oral)  Resp 16  Ht 5' (1.524 m)  Wt 178 lb (80.74 kg)  BMI 34.76 kg/m2  SpO2 98%  LMP 07/17/2015 Physical Exam  Constitutional: She is oriented to person, place, and time. She appears well-developed and well-nourished. No distress.  HENT:  Head: Normocephalic and atraumatic.  Eyes: EOM are normal. Pupils are  equal, round, and reactive to light.  Neck: Normal range of motion. Neck supple.  Cardiovascular: Normal rate and regular rhythm.  Exam reveals no gallop and no friction rub.   No murmur heard. Pulmonary/Chest: Effort normal. She has no wheezes. She has no rales.  Abdominal: Soft. She exhibits no distension. There is tenderness (worst to  the epigatrium, mild RUQ ttp, negative murphys). There is no rebound and no guarding.  Musculoskeletal: She exhibits no edema or tenderness.  Neurological: She is alert and oriented to person, place, and time.  Skin: Skin is warm and dry. She is not diaphoretic.  Psychiatric: She has a normal mood and affect. Her behavior is normal.  Nursing note and vitals reviewed.   ED Course  Procedures (including critical care time) Labs Review Labs Reviewed  URINALYSIS, ROUTINE W REFLEX MICROSCOPIC (NOT AT [facility]) - Abnormal; Notable for the following:    Hgb urine dipstick LARGE (*)    Protein, ur 30 (*)    Leukocytes, UA SMALL (*)    All other components within normal limits  CBC WITH DIFFERENTIAL/PLATELET - Abnormal; Notable for the following:    Hemoglobin 8.9 (*)    HCT 29.9 (*)    MCV 76.5 (*)    MCH 22.8 (*)    MCHC 29.8 (*)    RDW 17.5 (*)    Platelets 425 (*)    All other components within normal limits  COMPREHENSIVE METABOLIC PANEL - Abnormal; Notable for the following:    Glucose, Bld 103 (*)    AST 14 (*)    ALT 12 (*)    All other components within normal limits  URINE MICROSCOPIC-ADD ON - Abnormal; Notable for the following:    Squamous Epithelial / LPF 0-5 (*)    Bacteria, UA MANY (*)    All other components within normal limits  PREGNANCY, URINE  LIPASE, BLOOD    Imaging Review US Abdomen Complete  07/18/2015  CLINICAL DATA:  Epigastric pain. EXAM: ULTRASOUND ABDOMEN COMPLETE COMPARISON:  04/29/2014 . FINDINGS: Gallbladder: No gallstones or wall thickening visualized. No sonographic Murphy sign noted. Common bile duct: Diameter: 4 mm. Liver: No focal lesion identified. Within normal limits in parenchymal echogenicity. IVC: No abnormality visualized. Pancreas: Visualized portion unremarkable. Spleen: Size and appearance within normal limits. Right Kidney: Length: 9.6 cm. Echogenicity within normal limits. No mass or hydronephrosis visualized. Left Kidney: Length: 9.9 cm.  Echogenicity within normal limits. No hydronephrosis visualized. 1.5 x 1.9 x 1.8 cm complex cyst with slightly thickened septation and slightly irregular wall. Gadolinium-enhanced MRI suggested for further evaluation. Abdominal aorta: No aneurysm visualized. Other findings: None. IMPRESSION: 1. No acute abnormality. 2. 1.5 x 1.9 x 1.8 cm complex cyst with slightly thickened septations slightly irregular wall. Gadolinium-enhanced MRI suggested for further evaluation. Electronically Signed   By: Marcello Moores  Register   On: 07/18/2015 09:50   Ct Abdomen Pelvis W Contrast  07/18/2015  CLINICAL DATA:  Epigastric pain with cramping and nausea for 1 day. No previous abdominal surgery. Initial encounter. EXAM: CT ABDOMEN AND PELVIS WITH CONTRAST TECHNIQUE: Multidetector CT imaging of the abdomen and pelvis was performed using the standard protocol following bolus administration of intravenous contrast. CONTRAST:  90 OMNIPAQUE IOHEXOL 300 MG/ML  SOLN COMPARISON:  Abdominal ultrasound 07/18/2015. FINDINGS: Lower chest: Clear lung bases. No significant pleural or pericardial effusion. Hepatobiliary: The liver is normal in density without focal abnormality. No evidence of gallstones, gallbladder wall thickening or biliary dilatation. Pancreas: Unremarkable. No pancreatic ductal dilatation or surrounding inflammatory  changes. Spleen: Normal in size without focal abnormality. Adrenals/Urinary Tract: Both adrenal glands appear normal. Mildly complex septated cystic lesion is noted in the upper interpolar region of the left kidney as seen on earlier ultrasound. This measures 1.5 x 1.5 cm on image 30 and measures 18-22 HU. Additional tiny low-density lesions are noted in the upper pole of the right kidney (image 26) and posteriorly in the mid left kidney (image 30) of series 2, too small to characterize. No evidence of hydronephrosis or urinary tract calculus. The bladder appears normal. Stomach/Bowel: There is circumferential wall  thickening of the distal stomach which persists on the delayed post-contrast images. No focal ulceration or surrounding inflammation identified. No evidence of small or large bowel wall thickening or surrounding inflammation. The appendix appears normal. Vascular/Lymphatic: There are no enlarged abdominal or pelvic lymph nodes. No significant vascular findings. Retro aortic left renal vein noted. Reproductive: Unremarkable.  No evidence of adnexal mass. Other: No evidence of abdominal wall mass or hernia. Musculoskeletal: No acute or significant osseous findings. IMPRESSION: 1. Circumferential distal gastric wall thickening suspicious for gastritis. Endoscopic correlation should be considered if symptoms fail to respond to appropriate conservative therapy. 2. No other acute findings demonstrated. 3. The complex cystic lesion in the left kidney demonstrated on earlier ultrasound is not fully characterized by this examination which does not include pre contrast images. Benign etiology likely in this 44 year old, probably a Bosniak 2 F lesion. Management options include 6 month follow-up ultrasound to assess stability or dedicated renal MRI (without and with contrast) for more definitive characterization. Electronically Signed   By: Richardean Sale M.D.   On: 07/18/2015 10:50   I have personally reviewed and evaluated these images and lab results as part of my medical decision-making.   EKG Interpretation None      MDM   Final diagnoses:  Epigastric pain  Gastritis  Renal cyst    44 yo F with a chief complaint of epigastric abdominal pain. This pain is crampy coming and going. Sharp and stabbing. Denies worsening with food but unable to eat this morning. Feel most likely gastric in etiology we'll obtain abdominal labs give a GI cocktail. If pain persists will obtain a right upper quadrant ultrasound to rule out cholecystitis.  Patient with persistent abdominal pain right upper quadrant ultrasound  ordered. Patient having persisting continued symptoms after ultrasound. Ultrasound without gallbladder findings however there is some concern for a complex cyst of the left kidney. Feel it is unlikely to be abscesses there is no continued pain. Will obtain a CT scan with contrast to further evaluate the abdomen and pelvis. UA with trace leukocytes and too many to count bacteria. No UTI type symptoms.  Patient feeling better after 12 mg of morphine and GI cocktail. No recurrent abdominal pain on repeat exam. CT scan with likely gastritis as the etiology. This fits the clinical picture. Patient with a cyst re-seen on CT scan will have her follow-up with her PCP for reimaging in 6 months.  11:12 AM:  I have discussed the diagnosis/risks/treatment options with the patient and believe the pt to be eligible for discharge home to follow-up with PCP. We also discussed returning to the ED immediately if new or worsening sx occur. We discussed the sx which are most concerning (e.g., sudden worsening pain, fever, inability to tolerate by mouth) that necessitate immediate return. Medications administered to the patient during their visit and any new prescriptions provided to the patient are listed below.  Medications given  during this visit Medications  iohexol (OMNIPAQUE) 300 MG/ML solution 30 mL (30 mLs Intravenous Contrast Given 07/18/15 1013)  sodium chloride 0.9 % bolus 1,000 mL (0 mLs Intravenous Stopped 07/18/15 0925)  ondansetron (ZOFRAN) injection 4 mg (4 mg Intravenous Given 07/18/15 0824)  gi cocktail (Maalox,Lidocaine,Donnatal) (30 mLs Oral Given 07/18/15 0824)  morphine 4 MG/ML injection 4 mg (4 mg Intravenous Given 07/18/15 0851)  ketorolac (TORADOL) 30 MG/ML injection 30 mg (30 mg Intravenous Given 07/18/15 0851)  morphine 4 MG/ML injection 8 mg (8 mg Intravenous Given 07/18/15 0944)    New Prescriptions   ONDANSETRON (ZOFRAN) 4 MG TABLET    Take 1 tablet (4 mg total) by mouth every 6 (six)  hours.    The patient appears reasonably screen and/or stabilized for discharge and I doubt any other medical condition or other [facility] requiring further screening, evaluation, or treatment in the ED at this time prior to discharge.    Deno Etienne, DO 07/18/15 1112

## 2015-07-18 NOTE — ED Notes (Signed)
Reports upper abdominal pain that comes and goes approx every 5 minutes. Pt reports nausea last night but not now.  Reports she "feels like its labor pains but she is not pregnant".  Reports started period last night too.

## 2015-07-19 ENCOUNTER — Emergency Department (HOSPITAL_BASED_OUTPATIENT_CLINIC_OR_DEPARTMENT_OTHER)
Admission: EM | Admit: 2015-07-19 | Discharge: 2015-07-19 | Disposition: A | Discharge status: Home / Self Care | Payer: 59 | Attending: Emergency Medicine | Admitting: Emergency Medicine

## 2015-07-19 ENCOUNTER — Encounter (HOSPITAL_BASED_OUTPATIENT_CLINIC_OR_DEPARTMENT_OTHER): Payer: Self-pay

## 2015-07-19 DIAGNOSIS — D649 Anemia, unspecified: Secondary | ICD-10-CM | POA: Diagnosis not present

## 2015-07-19 DIAGNOSIS — R51 Headache: Secondary | ICD-10-CM | POA: Insufficient documentation

## 2015-07-19 DIAGNOSIS — R112 Nausea with vomiting, unspecified: Secondary | ICD-10-CM | POA: Diagnosis not present

## 2015-07-19 DIAGNOSIS — R1013 Epigastric pain: Secondary | ICD-10-CM | POA: Insufficient documentation

## 2015-07-19 DIAGNOSIS — D6489 Other specified anemias: Secondary | ICD-10-CM

## 2015-07-19 LAB — COMPREHENSIVE METABOLIC PANEL
ALK PHOS: 74 U/L (ref 38–126)
ALT: 12 U/L — AB (ref 14–54)
AST: 18 U/L (ref 15–41)
Albumin: 3.7 g/dL (ref 3.5–5.0)
Anion gap: 5 (ref 5–15)
BILIRUBIN TOTAL: 0.3 mg/dL (ref 0.3–1.2)
BUN: 9 mg/dL (ref 6–20)
CO2: 26 mmol/L (ref 22–32)
Calcium: 8.8 mg/dL — ABNORMAL LOW (ref 8.9–10.3)
Chloride: 103 mmol/L (ref 101–111)
Creatinine, Ser: 0.9 mg/dL (ref 0.44–1.00)
GFR calc Af Amer: >60 mL/min (ref >60)
GLUCOSE: 109 mg/dL — AB (ref 65–99)
Potassium: 3.6 mmol/L (ref 3.5–5.1)
Sodium: 134 mmol/L — ABNORMAL LOW (ref 135–145)
Total Protein: 7.6 g/dL (ref 6.5–8.1)

## 2015-07-19 LAB — CBC WITH DIFFERENTIAL/PLATELET
BASOS ABS: 0 10*3/uL (ref 0.0–0.1)
Basophils Relative: 0 %
Eosinophils Absolute: 0.1 10*3/uL (ref 0.0–0.7)
Eosinophils Relative: 2 %
HEMATOCRIT: 28.3 % — AB (ref 36.0–46.0)
Hemoglobin: 8.6 g/dL — ABNORMAL LOW (ref 12.0–15.0)
LYMPHS ABS: 2 10*3/uL (ref 0.7–4.0)
LYMPHS PCT: 33 %
MCH: 23.2 pg — ABNORMAL LOW (ref 26.0–34.0)
MCHC: 30.4 g/dL (ref 30.0–36.0)
MCV: 76.5 fL — AB (ref 78.0–100.0)
Monocytes Absolute: 0.3 10*3/uL (ref 0.1–1.0)
Monocytes Relative: 6 %
NEUTROS ABS: 3.6 10*3/uL (ref 1.7–7.7)
Neutrophils Relative %: 59 %
Platelets: 427 10*3/uL — ABNORMAL HIGH (ref 150–400)
RBC: 3.7 MIL/uL — AB (ref 3.87–5.11)
RDW: 17.8 % — ABNORMAL HIGH (ref 11.5–15.5)
WBC: 6 10*3/uL (ref 4.0–10.5)

## 2015-07-19 LAB — URINALYSIS, ROUTINE W REFLEX MICROSCOPIC
Bilirubin Urine: NEGATIVE
GLUCOSE, UA: NEGATIVE mg/dL
Ketones, ur: NEGATIVE mg/dL
LEUKOCYTES UA: NEGATIVE
Nitrite: NEGATIVE
PROTEIN: NEGATIVE mg/dL
SPECIFIC GRAVITY, URINE: 1.011 (ref 1.005–1.030)
pH: 7 (ref 5.0–8.0)

## 2015-07-19 LAB — URINE MICROSCOPIC-ADD ON

## 2015-07-19 LAB — LIPASE, BLOOD: LIPASE: 27 U/L (ref 11–51)

## 2015-07-19 MED ORDER — SODIUM CHLORIDE 0.9 % IV BOLUS (SEPSIS)
1000.0000 mL | Freq: Once | INTRAVENOUS | Status: AC
  Administered 2015-07-19: 1000 mL via INTRAVENOUS

## 2015-07-19 MED ORDER — PROMETHAZINE HCL 25 MG/ML IJ SOLN
12.5000 mg | Freq: Once | INTRAMUSCULAR | Status: AC
  Administered 2015-07-19: 12.5 mg via INTRAVENOUS
  Filled 2015-07-19: qty 1

## 2015-07-19 MED ORDER — ONDANSETRON 4 MG PO TBDP: 4.0000 mg | ORAL_TABLET | Freq: Three times a day (TID) | ORAL | Status: DC | PRN

## 2015-07-19 MED ORDER — PANTOPRAZOLE SODIUM 40 MG IV SOLR
40.0000 mg | Freq: Once | INTRAVENOUS | Status: AC
  Administered 2015-07-19: 40 mg via INTRAVENOUS
  Filled 2015-07-19: qty 40

## 2015-07-19 NOTE — ED Notes (Signed)
C/o vomiting, abd pain since Sunday am-seen here yesterday for same-pt states she is no better-steady gait-NAD

## 2015-07-19 NOTE — ED Provider Notes (Signed)
CSN: [id number]     Arrival date & time 07/19/15  1212 History   First MD Initiated Contact with Patient 07/19/15 1334     Chief Complaint  Patient presents with  . Emesis     (Consider location/radiation/quality/duration/timing/severity/associated sxs/prior Treatment) HPI Comments: Pt comes in with c/o continued vomiting. She was seen yesterday and given zantac, zofran and pain medication. She has ct and [place] yesterday. She states that she is still unable to tolerate fluids and now she has a headache associated with it. No fever. She has not been able to keep down fluids  The history is provided by the patient. No language interpreter was used.    History reviewed. No pertinent past medical history. History reviewed. No pertinent past surgical history. No family history on file. Social History  Substance Use Topics  . Smoking status: Never Smoker   . Smokeless tobacco: None  . Alcohol Use: No   OB History    No data available     Review of Systems  All other systems reviewed and are negative.     Allergies  Review of patient's allergies indicates no known allergies.  Home Medications   Prior to Admission medications   Medication Sig Start Date End Date Taking? Authorizing Provider  ondansetron (ZOFRAN) 4 MG tablet Take 1 tablet (4 mg total) by mouth every 6 (six) hours. 07/18/15   Deno Etienne, DO   BP 148/100 mmHg  Pulse 64  Temp(Src) 98.2 F (36.8 C) (Oral)  Resp 18  Ht 5' (1.524 m)  Wt 80.74 kg  BMI 34.76 kg/m2  SpO2 100%  LMP 07/17/2015 Physical Exam  Constitutional: She is oriented to person, place, and time. She appears well-developed and well-nourished.  Cardiovascular: Normal rate and regular rhythm.   Pulmonary/Chest: Effort normal and breath sounds normal.  Abdominal: Soft. Bowel sounds are normal. There is tenderness in the epigastric area.  Musculoskeletal: Normal range of motion.  Neurological: She is alert and oriented to person, place, and time.   Skin: Skin is warm and dry.  Psychiatric: She has a normal mood and affect.  Nursing note and vitals reviewed.   ED Course  Procedures (including critical care time) Labs Review Labs Reviewed  URINALYSIS, ROUTINE W REFLEX MICROSCOPIC (NOT AT [facility])  CBC WITH DIFFERENTIAL/PLATELET  COMPREHENSIVE METABOLIC PANEL  LIPASE, BLOOD    Imaging Review US Abdomen Complete  07/18/2015  CLINICAL DATA:  Epigastric pain. EXAM: ULTRASOUND ABDOMEN COMPLETE COMPARISON:  04/29/2014 . FINDINGS: Gallbladder: No gallstones or wall thickening visualized. No sonographic Murphy sign noted. Common bile duct: Diameter: 4 mm. Liver: No focal lesion identified. Within normal limits in parenchymal echogenicity. IVC: No abnormality visualized. Pancreas: Visualized portion unremarkable. Spleen: Size and appearance within normal limits. Right Kidney: Length: 9.6 cm. Echogenicity within normal limits. No mass or hydronephrosis visualized. Left Kidney: Length: 9.9 cm. Echogenicity within normal limits. No hydronephrosis visualized. 1.5 x 1.9 x 1.8 cm complex cyst with slightly thickened septation and slightly irregular wall. Gadolinium-enhanced MRI suggested for further evaluation. Abdominal aorta: No aneurysm visualized. Other findings: None. IMPRESSION: 1. No acute abnormality. 2. 1.5 x 1.9 x 1.8 cm complex cyst with slightly thickened septations slightly irregular wall. Gadolinium-enhanced MRI suggested for further evaluation. Electronically Signed   By: Marcello Moores  Register   On: 07/18/2015 09:50   Ct Abdomen Pelvis W Contrast  07/18/2015  CLINICAL DATA:  Epigastric pain with cramping and nausea for 1 day. No previous abdominal surgery. Initial encounter. EXAM: CT ABDOMEN AND PELVIS WITH  CONTRAST TECHNIQUE: Multidetector CT imaging of the abdomen and pelvis was performed using the standard protocol following bolus administration of intravenous contrast. CONTRAST:  90 OMNIPAQUE IOHEXOL 300 MG/ML  SOLN COMPARISON:  Abdominal  ultrasound 07/18/2015. FINDINGS: Lower chest: Clear lung bases. No significant pleural or pericardial effusion. Hepatobiliary: The liver is normal in density without focal abnormality. No evidence of gallstones, gallbladder wall thickening or biliary dilatation. Pancreas: Unremarkable. No pancreatic ductal dilatation or surrounding inflammatory changes. Spleen: Normal in size without focal abnormality. Adrenals/Urinary Tract: Both adrenal glands appear normal. Mildly complex septated cystic lesion is noted in the upper interpolar region of the left kidney as seen on earlier ultrasound. This measures 1.5 x 1.5 cm on image 30 and measures 18-22 HU. Additional tiny low-density lesions are noted in the upper pole of the right kidney (image 26) and posteriorly in the mid left kidney (image 30) of series 2, too small to characterize. No evidence of hydronephrosis or urinary tract calculus. The bladder appears normal. Stomach/Bowel: There is circumferential wall thickening of the distal stomach which persists on the delayed post-contrast images. No focal ulceration or surrounding inflammation identified. No evidence of small or large bowel wall thickening or surrounding inflammation. The appendix appears normal. Vascular/Lymphatic: There are no enlarged abdominal or pelvic lymph nodes. No significant vascular findings. Retro aortic left renal vein noted. Reproductive: Unremarkable.  No evidence of adnexal mass. Other: No evidence of abdominal wall mass or hernia. Musculoskeletal: No acute or significant osseous findings. IMPRESSION: 1. Circumferential distal gastric wall thickening suspicious for gastritis. Endoscopic correlation should be considered if symptoms fail to respond to appropriate conservative therapy. 2. No other acute findings demonstrated. 3. The complex cystic lesion in the left kidney demonstrated on earlier ultrasound is not fully characterized by this examination which does not include pre contrast  images. Benign etiology likely in this 44 year old, probably a Bosniak 2 F lesion. Management options include 6 month follow-up ultrasound to assess stability or dedicated renal MRI (without and with contrast) for more definitive characterization. Electronically Signed   By: Richardean Sale M.D.   On: 07/18/2015 10:50   I have personally reviewed and evaluated these images and lab results as part of my medical decision-making.   EKG Interpretation None      MDM   Final diagnoses:  Anemia due to other cause  Nausea and vomiting, vomiting of unspecified type    Pt is tolerating po without any problem. Likely gastritis. Pt given zofran odt    Glendell Docker, NP 07/19/15 [place], MD 07/20/15 [phone]

## 2015-07-19 NOTE — Discharge Instructions (Signed)
Anemia, Nonspecific Anemia is a condition in which the concentration of red blood cells or hemoglobin in the blood is below normal. Hemoglobin is a substance in red blood cells that carries oxygen to the tissues of the body. Anemia results in not enough oxygen reaching these tissues.  CAUSES  Common causes of anemia include:   Excessive bleeding. Bleeding may be internal or external. This includes excessive bleeding from periods (in women) or from the intestine.   Poor nutrition.   Chronic kidney, thyroid, and liver disease.  Bone marrow disorders that decrease red blood cell production.  Cancer and treatments for cancer.  HIV, AIDS, and their treatments.  Spleen problems that increase red blood cell destruction.  Blood disorders.  Excess destruction of red blood cells due to infection, medicines, and autoimmune disorders. SIGNS AND SYMPTOMS   Minor weakness.   Dizziness.   Headache.  Palpitations.   Shortness of breath, especially with exercise.   Paleness.  Cold sensitivity.  Indigestion.  Nausea.  Difficulty sleeping.  Difficulty concentrating. Symptoms may occur suddenly or they may develop slowly.  DIAGNOSIS  Additional blood tests are often needed. These help your health care provider determine the best treatment. Your health care provider will check your stool for blood and look for other causes of blood loss.  TREATMENT  Treatment varies depending on the cause of the anemia. Treatment can include:   Supplements of iron, vitamin 123456, or folic acid.   Hormone medicines.   A blood transfusion. This may be needed if blood loss is severe.   Hospitalization. This may be needed if there is significant continual blood loss.   Dietary changes.  Spleen removal. HOME CARE INSTRUCTIONS Keep all follow-up appointments. It often takes many weeks to correct anemia, and having your health care provider check on your condition and your response to  treatment is very important. SEEK IMMEDIATE MEDICAL CARE IF:   You develop extreme weakness, shortness of breath, or chest pain.   You become dizzy or have trouble concentrating.  You develop heavy vaginal bleeding.   You develop a rash.   You have bloody or black, tarry stools.   You faint.   You vomit up blood.   You vomit repeatedly.   You have abdominal pain.  You have a fever or persistent symptoms for more than 2-3 days.   You have a fever and your symptoms suddenly get worse.   You are dehydrated.  MAKE SURE YOU:  Understand these instructions.  Will watch your condition.  Will get help right away if you are not doing well or get worse.   This information is not intended to replace advice given to you by your health care provider. Make sure you discuss any questions you have with your health care provider.   Document Released: 09/13/2004 Document Revised: 04/08/2013 Document Reviewed: 01/30/2013 Elsevier Interactive Patient Education 2016 Else[employer]  Nausea and Vomiting Nausea means you feel sick to your stomach. Throwing up (vomiting) is a reflex where stomach contents come out of your mouth. HOME CARE   Take medicine as told by your doctor.  Do not force yourself to eat. However, you do need to drink fluids.  If you feel like eating, eat a normal diet as told by your doctor.  Eat rice, wheat, potatoes, bread, lean meats, yogurt, fruits, and vegetables.  Avoid high-fat foods.  Drink enough fluids to keep your pee (urine) clear or pale yellow.  Ask your doctor how to replace body fluid  losses (rehydrate). Signs of body fluid loss (dehydration) include:  Feeling very thirsty.  Dry lips and mouth.  Feeling dizzy.  Dark pee.  Peeing less than normal.  Feeling confused.  Fast breathing or heart rate. GET HELP RIGHT AWAY IF:   You have blood in your throw up.  You have black or bloody poop (stool).  You have a bad headache or  stiff neck.  You feel confused.  You have bad belly (abdominal) pain.  You have chest pain or trouble breathing.  You do not pee at least once every 8 hours.  You have cold, clammy skin.  You keep throwing up after 24 to 48 hours.  You have a fever. MAKE SURE YOU:   Understand these instructions.  Will watch your condition.  Will get help right away if you are not doing well or get worse.   This information is not intended to replace advice given to you by your health care provider. Make sure you discuss any questions you have with your health care provider.   Document Released: 01/23/2008 Document Revised: 10/29/2011 Document Reviewed: 01/05/2011 Elsevier Interactive Patient Education [employer].

## 2015-07-20 ENCOUNTER — Encounter: Payer: Self-pay | Admitting: Medical

## 2015-07-20 ENCOUNTER — Telehealth: Payer: Self-pay | Admitting: Medical

## 2015-07-20 ENCOUNTER — Ambulatory Visit (INDEPENDENT_AMBULATORY_CARE_PROVIDER_SITE_OTHER): Payer: 59 | Admitting: Medical

## 2015-07-20 VITALS — BP 136/88 | HR 67 | Temp 98.1°F | Ht 63.0 in | Wt 190.0 lb

## 2015-07-20 DIAGNOSIS — R1013 Epigastric pain: Secondary | ICD-10-CM | POA: Diagnosis not present

## 2015-07-20 DIAGNOSIS — K5900 Constipation, unspecified: Secondary | ICD-10-CM | POA: Diagnosis not present

## 2015-07-20 DIAGNOSIS — D649 Anemia, unspecified: Secondary | ICD-10-CM | POA: Diagnosis not present

## 2015-07-20 DIAGNOSIS — R112 Nausea with vomiting, unspecified: Secondary | ICD-10-CM

## 2015-07-20 DIAGNOSIS — N281 Cyst of kidney, acquired: Secondary | ICD-10-CM

## 2015-07-20 LAB — COMPREHENSIVE METABOLIC PANEL
ALBUMIN: 3.7 g/dL (ref 3.6–5.1)
ALK PHOS: 71 U/L (ref 33–115)
ALT: 8 U/L (ref 6–29)
AST: 12 U/L (ref 10–35)
BILIRUBIN TOTAL: 0.4 mg/dL (ref 0.2–1.2)
BUN: 6 mg/dL — ABNORMAL LOW (ref 7–25)
CALCIUM: 9 mg/dL (ref 8.6–10.2)
CO2: 28 mmol/L (ref 20–31)
Chloride: 105 mmol/L (ref 98–110)
Creat: 0.82 mg/dL (ref 0.50–1.10)
GLUCOSE: 85 mg/dL (ref 65–99)
POTASSIUM: 4.5 mmol/L (ref 3.5–5.3)
Sodium: 137 mmol/L (ref 135–146)
Total Protein: 7.2 g/dL (ref 6.1–8.1)

## 2015-07-20 LAB — CBC WITH DIFFERENTIAL/PLATELET
BASOS ABS: 0 10*3/uL (ref 0.0–0.1)
Basophils Relative: 0 % (ref 0–1)
Eosinophils Absolute: 0.1 10*3/uL (ref 0.0–0.7)
Eosinophils Relative: 2 % (ref 0–5)
HEMATOCRIT: 27.8 % — AB (ref 36.0–46.0)
Hemoglobin: 8.5 g/dL — ABNORMAL LOW (ref 12.0–15.0)
LYMPHS ABS: 1.9 10*3/uL (ref 0.7–4.0)
LYMPHS PCT: 38 % (ref 12–46)
MCH: 23 pg — ABNORMAL LOW (ref 26.0–34.0)
MCHC: 30.6 g/dL (ref 30.0–36.0)
MCV: 75.3 fL — AB (ref 78.0–100.0)
MPV: 9 fL (ref 8.6–12.4)
Monocytes Absolute: 0.2 10*3/uL (ref 0.1–1.0)
Monocytes Relative: 5 % (ref 3–12)
NEUTROS ABS: 2.7 10*3/uL (ref 1.7–7.7)
NEUTROS PCT: 55 % (ref 43–77)
Platelets: 467 10*3/uL — ABNORMAL HIGH (ref 150–400)
RBC: 3.69 MIL/uL — AB (ref 3.87–5.11)
RDW: 17.5 % — AB (ref 11.5–15.5)
WBC: 4.9 10*3/uL (ref 4.0–10.5)

## 2015-07-20 LAB — LIPASE: LIPASE: 56 U/L (ref 7–60)

## 2015-07-20 LAB — IRON AND TIBC
%SAT: 5 % — AB (ref 11–50)
IRON: 18 ug/dL — AB (ref 40–190)
TIBC: 385 ug/dL (ref 250–450)
UIBC: 367 ug/dL (ref 125–400)

## 2015-07-20 LAB — VITAMIN B12: Vitamin B-12: 493 pg/mL (ref 211–911)

## 2015-07-20 LAB — FERRITIN: FERRITIN: 6 ng/mL — AB (ref 10–291)

## 2015-07-20 LAB — AMYLASE: AMYLASE: 82 U/L (ref 0–105)

## 2015-07-20 MED ORDER — ONDANSETRON HCL 8 MG PO TABS: 8.0000 mg | ORAL_TABLET | Freq: Three times a day (TID) | ORAL | Status: DC | PRN

## 2015-07-20 MED ORDER — FERROUS SULFATE 325 (65 FE) MG PO TABS: 325.0000 mg | ORAL_TABLET | Freq: Two times a day (BID) | ORAL | Status: DC

## 2015-07-20 MED ORDER — OMEPRAZOLE 40 MG PO CPDR: 40.0000 mg | DELAYED_RELEASE_CAPSULE | Freq: Every day | ORAL | Status: DC

## 2015-07-20 NOTE — Progress Notes (Signed)
Pre visit review using our clinic review tool, if applicable. No additional management support is needed unless otherwise documented below in the visit note. 

## 2015-07-20 NOTE — Assessment & Plan Note (Signed)
Will need to follow by [place] or mri in 3-6 months.

## 2015-07-20 NOTE — Progress Notes (Signed)
Subjective:    Patient ID: Yesenia Mills, female    DOB: 22-Feb-1971, 44 y.o.   MRN: [id number]  HPI  I have reviewed pt PMH, PSH, FH, Social History and Surgical History.  On review denies past medical history.  But recently in ED diagnosed with gastritis. Pt states Sunday had some some intermittent abdomen pain. She points to epgistric area. Pain also at times worse after eating. Some nausea and vomiting. Pt has some constipation. She has very small hard stools at most. No hx of abdomen surgeries.   Pt had ct of abdomen showing some finding likely gastritis.  Also completx cyst left kidney.  Pt states on Monday looked dark black.  Blood work recently in ED did show some anemia. No history of anemia. Pt lipase was normal other day. Cmp showed some mild abnormalities.  Pt is taking zantac 150 mg qid. She states it does seem to help.     Review of Systems  Constitutional: Negative for fever, chills, diaphoresis, activity change and fatigue.  Respiratory: Negative for cough, chest tightness and shortness of breath.   Cardiovascular: Negative for chest pain, palpitations and leg swelling.  Gastrointestinal: Positive for nausea, vomiting and abdominal pain. Negative for diarrhea.  Musculoskeletal: Negative for neck pain and neck stiffness.  Neurological: Negative for dizziness, tremors, seizures, syncope, facial asymmetry, speech difficulty, weakness, light-headedness, numbness and headaches.  Psychiatric/Behavioral: Negative for behavioral problems, confusion and agitation. The patient is not nervous/anxious.     History reviewed. No pertinent past medical history.  Social History   Social History  . Marital Status: Married    Spouse Name: N/A  . Number of Children: N/A  . Years of Education: N/A   Occupational History  . Not on file.   Social History Main Topics  . Smoking status: Never Smoker   . Smokeless tobacco: Not on file  . Alcohol Use: No  . Drug Use: No    . Sexual Activity: Not on file   Other Topics Concern  . Not on file   Social History Narrative    History reviewed. No pertinent past surgical history.  Family History  Problem Relation Age of Onset  . Hypertension Mother     No Known Allergies  Current Outpatient Prescriptions on File Prior to Visit  Medication Sig Dispense Refill  . ondansetron (ZOFRAN ODT) 4 MG disintegrating tablet Take 1 tablet (4 mg total) by mouth every 8 (eight) hours as needed for nausea or vomiting. 20 tablet 0  . ondansetron (ZOFRAN) 4 MG tablet Take 1 tablet (4 mg total) by mouth every 6 (six) hours. 12 tablet 0   No current facility-administered medications on file prior to visit.    BP 136/88 mmHg  Pulse 67  Temp(Src) 98.1 F (36.7 C) (Oral)  Ht 5\' 3"  (1.6 m)  Wt 190 lb (86.183 kg)  BMI 33.67 kg/m2  SpO2 98%  LMP 07/17/2015       Objective:   Physical Exam   General Appearance- Not in acute distress.  HEENT Eyes- Scleraeral/Conjuntiva-bilat- Not Yellow. Mouth & Throat- Normal.  Chest and Lung Exam Auscultation: Breath sounds:-Normal. Adventitious sounds:- No Adventitious sounds.  Cardiovascular Auscultation:Rythm - Regular. Heart Sounds -Normal heart sounds.  Abdomen Inspection:-Inspection Normal.  Palpation/Perucssion: Palpation and Percussion of the abdomen reveal- mild faint epigastric Tender, No Rebound tenderness, No rigidity(Guarding) and No Palpable abdominal masses.  Liver:-Normal.  Spleen:- Normal.        Assessment & Plan:   For your epigastric pain.  Continue zantac. But I am adding omeprazole rx.  For constipation get abd xray today. Get miralax otc and use today/tonight. Update me by tomorrow if you have had bm.  Get labs today cbc, cmp, amylase,lipase, hpylori and anemia panel.  Rx zofran for vomiting.  Turn in ifob stat  May need to refer to GI if pain not improving.   Update on Friday on how you are. May need to get ct again if in severe  pain. If severe sign or symptom change then ED after hours.  Will need to follow your kidney cyst.

## 2015-07-20 NOTE — Patient Instructions (Addendum)
For your epigastric pain. Continue zantac. But I am adding omeprazole rx.  For constipation get abd xray today. Get miralax otc and use today/tonight. Update me by tomorrow if you have had bm.  Get labs today cbc, cmp, amylase,lipase, hpylori and anemia panel.  Rx zofran for vomiting.  Turn in ifob stat  May need to refer to GI if pain not improving.   Update on Friday on how you are. May need to get ct again if in severe pain. If severe sign or symptom change then ED after hours.  Will need to follow your kidney cyst.  Follow up this coming Tuesday in am as well

## 2015-07-20 NOTE — Telephone Encounter (Signed)
rx ferrous sulfate sent to pt pharmacy

## 2015-07-20 NOTE — Assessment & Plan Note (Signed)
For constipation get abd xray today. Get miralax otc and use today/tonight. Update me by tomorrow if you have had bm.

## 2015-07-20 NOTE — Assessment & Plan Note (Signed)
  Turn in ifob stat. Cbc stat. May refer to Gi depending if blood in stool.

## 2015-07-20 NOTE — Assessment & Plan Note (Signed)
For your epigastric pain. Continue zantac. But I am adding omeprazole rx.

## 2015-07-21 ENCOUNTER — Encounter: Payer: Self-pay | Admitting: Medical

## 2015-07-21 ENCOUNTER — Telehealth: Payer: Self-pay | Admitting: Medical

## 2015-07-21 LAB — H. PYLORI BREATH TEST: H. pylori Breath Test: NOT DETECTED

## 2015-07-21 NOTE — Telephone Encounter (Signed)
Spoke with pt and she states that she is still having some abdominal pain and per ES to go to [facility] ED to be evaluated.

## 2015-07-21 NOTE — Telephone Encounter (Signed)
Caller name:Teanna Relationship to patient:self Can be [phone] Pharmacy: [facility] [address]   Reason for call:severe abd pain in the center above her waist.  She denies nausea but says she feels weak  Please call the patient

## 2015-07-21 NOTE — Telephone Encounter (Signed)
Pt called earlier today stating she was in more severe pain and stating she wanted to be admitted to hospital. I advised Barnet Pall to advise pt to go directly to Emergency Department at [facility] hospital now. Barnet Pall did advise pt.   Early in the day we asked her to turn in stool test so we could results.  Will send note to Barnet Pall to call pt tomorrow to see if she went to other hospital since when I checked tonight looked like she did not go to [facility] ED.

## 2015-07-21 NOTE — Telephone Encounter (Signed)
Patient has been notified and she voices understanding.

## 2015-07-26 ENCOUNTER — Encounter: Payer: Self-pay | Admitting: Medical

## 2015-07-26 ENCOUNTER — Ambulatory Visit (INDEPENDENT_AMBULATORY_CARE_PROVIDER_SITE_OTHER): Payer: 59 | Admitting: Medical

## 2015-07-26 ENCOUNTER — Ambulatory Visit (HOSPITAL_BASED_OUTPATIENT_CLINIC_OR_DEPARTMENT_OTHER)
Admission: RE | Admit: 2015-07-26 | Discharge: 2015-07-26 | Disposition: A | Discharge status: Home / Self Care | Payer: 59 | Source: Ambulatory Visit | Attending: Medical | Admitting: Medical

## 2015-07-26 VITALS — BP 148/89 | HR 75 | Temp 98.4°F | Ht 63.0 in | Wt 187.6 lb

## 2015-07-26 DIAGNOSIS — R1013 Epigastric pain: Secondary | ICD-10-CM

## 2015-07-26 DIAGNOSIS — K5900 Constipation, unspecified: Secondary | ICD-10-CM | POA: Insufficient documentation

## 2015-07-26 DIAGNOSIS — R112 Nausea with vomiting, unspecified: Secondary | ICD-10-CM | POA: Diagnosis not present

## 2015-07-26 DIAGNOSIS — D649 Anemia, unspecified: Secondary | ICD-10-CM | POA: Diagnosis not present

## 2015-07-26 LAB — CBC WITH DIFFERENTIAL/PLATELET
BASOS PCT: 0.6 % (ref 0.0–3.0)
Basophils Absolute: 0 10*3/uL (ref 0.0–0.1)
EOS PCT: 1.4 % (ref 0.0–5.0)
Eosinophils Absolute: 0.1 10*3/uL (ref 0.0–0.7)
HCT: 29 % — ABNORMAL LOW (ref 36.0–46.0)
Hemoglobin: 9.1 g/dL — ABNORMAL LOW (ref 12.0–15.0)
LYMPHS ABS: 1.9 10*3/uL (ref 0.7–4.0)
Lymphocytes Relative: 37.2 % (ref 12.0–46.0)
MCHC: 31.4 g/dL (ref 30.0–36.0)
MCV: 74.1 fl — AB (ref 78.0–100.0)
MONO ABS: 0.3 10*3/uL (ref 0.1–1.0)
MONOS PCT: 6.2 % (ref 3.0–12.0)
NEUTROS ABS: 2.7 10*3/uL (ref 1.4–7.7)
NEUTROS PCT: 54.6 % (ref 43.0–77.0)
Platelets: 424 10*3/uL — ABNORMAL HIGH (ref 150.0–400.0)
RBC: 3.92 Mil/uL (ref 3.87–5.11)
RDW: 18.6 % — AB (ref 11.5–15.5)
WBC: 5 10*3/uL (ref 4.0–10.5)

## 2015-07-26 LAB — COMPREHENSIVE METABOLIC PANEL
ALT: 8 U/L (ref 0–35)
AST: 13 U/L (ref 0–37)
Albumin: 3.9 g/dL (ref 3.5–5.2)
Alkaline Phosphatase: 78 U/L (ref 39–117)
BUN: 10 mg/dL (ref 6–23)
CHLORIDE: 104 meq/L (ref 96–112)
CO2: 30 mEq/L (ref 19–32)
Calcium: 9.5 mg/dL (ref 8.4–10.5)
Creatinine, Ser: 0.91 mg/dL (ref 0.40–1.20)
GFR: 85.79 mL/min (ref >60.00)
GLUCOSE: 96 mg/dL (ref 70–99)
POTASSIUM: 4.3 meq/L (ref 3.5–5.1)
SODIUM: 139 meq/L (ref 135–145)
TOTAL PROTEIN: 8 g/dL (ref 6.0–8.3)
Total Bilirubin: 0.5 mg/dL (ref 0.2–1.2)

## 2015-07-26 LAB — AMYLASE: AMYLASE: 69 U/L (ref 27–131)

## 2015-07-26 LAB — LIPASE: Lipase: 25 U/L (ref 11.0–59.0)

## 2015-07-26 NOTE — Progress Notes (Signed)
Subjective:    Patient ID: Yesenia Mills, female    DOB: Jul 03, 1971, 44 y.o.   MRN: [id number]  HPI   Pt in for follow up. Her abdomen discomfort is abdomen is about the same. Pt is getting otc iron.   Pt called back and stated she was in more abdomen pain. She went to [facility]. They gave hydration. They did some labs.   Some constipation. She has had no bowel movement since last Sunday.Pt tried some mag citrate and maalox. Pt also mentions she is not eating much.   Sometimes she will have to urinate very quickly without notice.   Pt had no hx of abdominal surgeries.  Pt states ED physician was thinking of admitted for GI work up.      Review of Systems  Constitutional: Negative for fever, chills and fatigue.  Respiratory: Negative for cough, chest tightness, shortness of breath and wheezing.   Cardiovascular: Negative for chest pain and palpitations.  Gastrointestinal: Positive for abdominal pain and constipation. Negative for nausea, vomiting and diarrhea.  Musculoskeletal: Negative for back pain.  Hematological: Negative for adenopathy. Does not bruise/bleed easily.  Psychiatric/Behavioral: Negative for behavioral problems and confusion.   No past medical history on file.  Social History   Social History  . Marital Status: Married    Spouse Name: N/A  . Number of Children: N/A  . Years of Education: N/A   Occupational History  . Not on file.   Social History Main Topics  . Smoking status: Never Smoker   . Smokeless tobacco: Not on file  . Alcohol Use: No  . Drug Use: No  . Sexual Activity: Yes    Birth Control/ Protection: None   Other Topics Concern  . Not on file   Social History Narrative    No past surgical history on file.  Family History  Problem Relation Age of Onset  . Hypertension Mother     No Known Allergies  Current Outpatient Prescriptions on File Prior to Visit  Medication Sig Dispense Refill  . ferrous sulfate 325  (65 FE) MG tablet Take 1 tablet (325 mg total) by mouth 2 (two) times daily with a meal. 60 tablet 3  . omeprazole (PRILOSEC) 40 MG capsule Take 1 capsule (40 mg total) by mouth daily. 30 capsule 3  . ondansetron (ZOFRAN ODT) 4 MG disintegrating tablet Take 1 tablet (4 mg total) by mouth every 8 (eight) hours as needed for nausea or vomiting. 20 tablet 0   No current facility-administered medications on file prior to visit.    BP 148/89 mmHg  Pulse 75  Temp(Src) 98.4 F (36.9 C) (Oral)  Ht 5\' 3"  (1.6 m)  Wt 187 lb 9.6 oz (85.095 kg)  BMI 33.24 kg/m2  SpO2 98%  LMP 07/23/2015       Objective:   Physical Exam  General Appearance- Not in acute distress.  HEENT Eyes- Scleraeral/Conjuntiva-bilat- Not Yellow. Mouth & Throat- Normal.  Chest and Lung Exam Auscultation: Breath sounds:-Normal. Adventitious sounds:- No Adventitious sounds.  Cardiovascular Auscultation:Rythm - Regular. Heart Sounds -Normal heart sounds.  Abdomen Inspection:-Inspection Normal.  Palpation/Perucssion: Palpation and Percussion of the abdomen reveal- mild epigasrtric Tender faint + bowel sounds, No Rebound tenderness, No rigidity(Guarding) and No Palpable abdominal masses.  Liver:-Normal.  Spleen:- Normal.   Rectal I explained since she is constipated and since anemic need to now if stool + for blood that would help me determine if stat Gi referral needed. She declined dre to  get sample. She wanted to try med for constipation and turn in Ifob later today. Our lab staff  with [facility] to see if she is succesful if we could get results ifob stat.  Back- no cva tenderness.      Assessment & Plan:  I ordered xray a week ago. It was on her summary/avs. She did not get it done. [phone].    For epigastric pain continue ranitidine and omeprazole. Get repeat cbc, cmp, amylase, and lipase.  For constipation make sure at least staying hydrated if her appetite is decreased. Get stat abd xray to see if  evidence of dilated bowel. May need to get ct abd/pelvis depending on xray results.Can try magnesium citrate otc.  Turn if ifob if today if you are successful collecting. Then can send to [facility] stat.  Follow up on Friday tentatively before weekend or as needed.  May go ahead and refer you to GI as well. Getting ifob result would help determine if needs stat/asap.

## 2015-07-26 NOTE — Progress Notes (Signed)
Pre visit review using our clinic review tool, if applicable. No additional management support is needed unless otherwise documented below in the visit note. 

## 2015-07-26 NOTE — Patient Instructions (Signed)
For epigastric pain continue ranitidine and omeprazole. Get repeat cbc, cmp, amylase, and lipase.  For constipation make sure at least staying hydrated if her appetite is decreased. Get stat abd xray to see if evidence of dilated bowel. May need to get ct abd/pelvis depending on xray results.Can try magnesium citrate otc.  Turn if ifob if today if you are successful collecting. Then can send to [facility] stat.  Follow up on Friday tentatively before weekend or as needed.  May go ahead and refer you to GI as well. Getting ifob result would help determine if needs stat/asap.

## 2015-07-27 ENCOUNTER — Telehealth: Payer: Self-pay | Admitting: Medical

## 2015-07-27 NOTE — Telephone Encounter (Signed)
Caller name: Self   Can be reached: [phone]    Reason for call: Patient request to be referred to another GI dr because [facility] can not see her until the end of February. Patient states she can not wait that long to be seen.

## 2015-07-27 NOTE — Telephone Encounter (Signed)
Patient was advised to turn the IFOB in as soon as possible.

## 2015-07-27 NOTE — Telephone Encounter (Signed)
Did pt turn in the ifob??

## 2015-09-22 ENCOUNTER — Emergency Department (HOSPITAL_BASED_OUTPATIENT_CLINIC_OR_DEPARTMENT_OTHER)
Admission: EM | Admit: 2015-09-22 | Discharge: 2015-09-22 | Disposition: A | Discharge status: Home / Self Care | Payer: 59 | Attending: Emergency Medicine | Admitting: Emergency Medicine

## 2015-09-22 ENCOUNTER — Emergency Department (HOSPITAL_BASED_OUTPATIENT_CLINIC_OR_DEPARTMENT_OTHER): Payer: 59

## 2015-09-22 ENCOUNTER — Encounter (HOSPITAL_BASED_OUTPATIENT_CLINIC_OR_DEPARTMENT_OTHER): Payer: Self-pay | Admitting: Emergency Medicine

## 2015-09-22 DIAGNOSIS — R112 Nausea with vomiting, unspecified: Secondary | ICD-10-CM | POA: Diagnosis not present

## 2015-09-22 DIAGNOSIS — Z3202 Encounter for pregnancy test, result negative: Secondary | ICD-10-CM | POA: Diagnosis not present

## 2015-09-22 DIAGNOSIS — Z79899 Other long term (current) drug therapy: Secondary | ICD-10-CM | POA: Insufficient documentation

## 2015-09-22 DIAGNOSIS — R1011 Right upper quadrant pain: Secondary | ICD-10-CM | POA: Diagnosis not present

## 2015-09-22 DIAGNOSIS — R1013 Epigastric pain: Secondary | ICD-10-CM | POA: Insufficient documentation

## 2015-09-22 LAB — CBC WITH DIFFERENTIAL/PLATELET
BASOS ABS: 0 10*3/uL (ref 0.0–0.1)
Basophils Relative: 0 %
EOS ABS: 0.1 10*3/uL (ref 0.0–0.7)
Eosinophils Relative: 2 %
HEMATOCRIT: 30.6 % — AB (ref 36.0–46.0)
Hemoglobin: 9 g/dL — ABNORMAL LOW (ref 12.0–15.0)
LYMPHS ABS: 2.3 10*3/uL (ref 0.7–4.0)
LYMPHS PCT: 34 %
MCH: 21.9 pg — ABNORMAL LOW (ref 26.0–34.0)
MCHC: 29.4 g/dL — AB (ref 30.0–36.0)
MCV: 74.5 fL — ABNORMAL LOW (ref 78.0–100.0)
MONO ABS: 0.3 10*3/uL (ref 0.1–1.0)
Monocytes Relative: 4 %
NEUTROS ABS: 4.2 10*3/uL (ref 1.7–7.7)
Neutrophils Relative %: 60 %
PLATELETS: 492 10*3/uL — AB (ref 150–400)
RBC: 4.11 MIL/uL (ref 3.87–5.11)
RDW: 17.9 % — AB (ref 11.5–15.5)
WBC: 6.9 10*3/uL (ref 4.0–10.5)

## 2015-09-22 LAB — URINALYSIS, ROUTINE W REFLEX MICROSCOPIC
Bilirubin Urine: NEGATIVE
Glucose, UA: NEGATIVE mg/dL
KETONES UR: NEGATIVE mg/dL
Nitrite: NEGATIVE
PROTEIN: NEGATIVE mg/dL
Specific Gravity, Urine: 1.023 (ref 1.005–1.030)
pH: 5.5 (ref 5.0–8.0)

## 2015-09-22 LAB — LIPASE, BLOOD: Lipase: 44 U/L (ref 11–51)

## 2015-09-22 LAB — COMPREHENSIVE METABOLIC PANEL
ALBUMIN: 3.8 g/dL (ref 3.5–5.0)
ALT: 23 U/L (ref 14–54)
ANION GAP: 7 (ref 5–15)
AST: 26 U/L (ref 15–41)
Alkaline Phosphatase: 75 U/L (ref 38–126)
BUN: 15 mg/dL (ref 6–20)
CHLORIDE: 100 mmol/L — AB (ref 101–111)
CO2: 26 mmol/L (ref 22–32)
Calcium: 9.7 mg/dL (ref 8.9–10.3)
Creatinine, Ser: 0.92 mg/dL (ref 0.44–1.00)
GFR calc Af Amer: >60 mL/min (ref >60)
GFR calc non Af Amer: >60 mL/min (ref >60)
GLUCOSE: 138 mg/dL — AB (ref 65–99)
POTASSIUM: 3.7 mmol/L (ref 3.5–5.1)
SODIUM: 133 mmol/L — AB (ref 135–145)
Total Bilirubin: 0.4 mg/dL (ref 0.3–1.2)
Total Protein: 8.2 g/dL — ABNORMAL HIGH (ref 6.5–8.1)

## 2015-09-22 LAB — URINE MICROSCOPIC-ADD ON

## 2015-09-22 LAB — PREGNANCY, URINE: PREG TEST UR: NEGATIVE

## 2015-09-22 MED ORDER — SODIUM CHLORIDE 0.9 % IV BOLUS (SEPSIS)
1000.0000 mL | Freq: Once | INTRAVENOUS | Status: AC
  Administered 2015-09-22: 1000 mL via INTRAVENOUS

## 2015-09-22 MED ORDER — GI COCKTAIL ~~LOC~~
30.0000 mL | Freq: Once | ORAL | Status: AC
  Administered 2015-09-22: 30 mL via ORAL
  Filled 2015-09-22: qty 30

## 2015-09-22 MED ORDER — ONDANSETRON HCL 4 MG PO TABS: 4.0000 mg | ORAL_TABLET | Freq: Four times a day (QID) | ORAL | Status: DC

## 2015-09-22 MED ORDER — MORPHINE SULFATE (PF) 4 MG/ML IV SOLN
4.0000 mg | Freq: Once | INTRAVENOUS | Status: AC
  Administered 2015-09-22: 4 mg via INTRAVENOUS
  Filled 2015-09-22: qty 1

## 2015-09-22 MED ORDER — ONDANSETRON HCL 4 MG/2ML IJ SOLN
4.0000 mg | Freq: Once | INTRAMUSCULAR | Status: AC
  Administered 2015-09-22: 4 mg via INTRAVENOUS
  Filled 2015-09-22: qty 2

## 2015-09-22 NOTE — Discharge Instructions (Signed)
Try zantac 150mg  twice a day.  Try to eat dinner 4 hours or greater prior to bed.  Follow up with GI.

## 2015-09-22 NOTE — ED Provider Notes (Signed)
CSN: [id number]     Arrival date & time 09/22/15  1422 History   First MD Initiated Contact with Patient 09/22/15 1503     Chief Complaint  Patient presents with  . Abdominal Pain     (Consider location/radiation/quality/duration/timing/severity/associated sxs/prior Treatment) Patient is a 45 y.o. female presenting with abdominal pain. The history is provided by the patient.  Abdominal Pain Pain location:  Epigastric Pain quality: sharp and shooting   Pain radiates to:  R shoulder Pain severity:  Moderate Onset quality:  Gradual Duration:  4 weeks Timing:  Intermittent Progression:  Worsening Chronicity:  Recurrent Relieved by:  OTC medications (zantac) Worsened by:  Position changes and eating Ineffective treatments:  None tried Associated symptoms: nausea and vomiting   Associated symptoms: no chest pain, no chills, no dysuria, no fever and no shortness of breath     45 yo F with a chief complaint of epigastric abdominal pain. This been going on for about a month. Patient states that she's had these symptoms before and that she had a stomach ulcer. Patient has recently started an appointment to follow-up with her gastroenterologist however is not been able to see them. Patient denies fevers or chills. Pain is sharp and burning in her epigastrium. Patient has some pain to her right shoulder as well. Denies bilious or bloody emesis. Denies fevers or chills. Denies prior surgery.  No past medical history on file. No past surgical history on file. Family History  Problem Relation Age of Onset  . Hypertension Mother    Social History  Substance Use Topics  . Smoking status: Never Smoker   . Smokeless tobacco: None  . Alcohol Use: No   OB History    No data available     Review of Systems  Constitutional: Negative for fever and chills.  HENT: Negative for congestion and rhinorrhea.   Eyes: Negative for redness and visual disturbance.  Respiratory: Negative for shortness  of breath and wheezing.   Cardiovascular: Negative for chest pain and palpitations.  Gastrointestinal: Positive for nausea, vomiting and abdominal pain.  Genitourinary: Negative for dysuria and urgency.  Musculoskeletal: Negative for myalgias and arthralgias.  Skin: Negative for pallor and wound.  Neurological: Negative for dizziness and headaches.      Allergies  Review of patient's allergies indicates no known allergies.  Home Medications   Prior to Admission medications   Medication Sig Start Date End Date Taking? Authorizing Provider  ferrous sulfate 325 (65 FE) MG tablet Take 1 tablet (325 mg total) by mouth 2 (two) times daily with a meal. 07/20/15   Mackie Pai, PA-C  omeprazole (PRILOSEC) 40 MG capsule Take 1 capsule (40 mg total) by mouth daily. 07/20/15   Edward Saguier, PA-C  ondansetron (ZOFRAN ODT) 4 MG disintegrating tablet Take 1 tablet (4 mg total) by mouth every 8 (eight) hours as needed for nausea or vomiting. 07/19/15   Glendell Docker, NP  ondansetron (ZOFRAN) 4 MG tablet Take 1 tablet (4 mg total) by mouth every 6 (six) hours. 09/22/15   Deno Etienne, DO   BP 146/79 mmHg  Pulse 67  Temp(Src) 98.2 F (36.8 C) (Oral)  Resp 16  Ht 5' (1.524 m)  Wt 191 lb (86.637 kg)  BMI 37.30 kg/m2  SpO2 96% Physical Exam  Constitutional: She is oriented to person, place, and time. She appears well-developed and well-nourished. No distress.  HENT:  Head: Normocephalic and atraumatic.  Eyes: EOM are normal. Pupils are equal, round, and reactive to light.  Neck: Normal range of motion. Neck supple.  Cardiovascular: Normal rate and regular rhythm.  Exam reveals no gallop and no friction rub.   No murmur heard. Pulmonary/Chest: Effort normal. She has no wheezes. She has no rales.  Abdominal: Soft. She exhibits no distension. There is tenderness (TTP worse in the epigastrium but also has pain in the right upper quadrant. Patient will not take a deep inspiration with my hand in  the right upper quadrant.). There is no rebound and no guarding.  Musculoskeletal: She exhibits no edema or tenderness.  Neurological: She is alert and oriented to person, place, and time.  Skin: Skin is warm and dry. She is not diaphoretic.  Psychiatric: She has a normal mood and affect. Her behavior is normal.  Nursing note and vitals reviewed.   ED Course  Procedures (including critical care time) Labs Review Labs Reviewed  URINALYSIS, ROUTINE W REFLEX MICROSCOPIC (NOT AT [facility]) - Abnormal; Notable for the following:    APPearance TURBID (*)    Hgb urine dipstick MODERATE (*)    Leukocytes, UA MODERATE (*)    All other components within normal limits  CBC WITH DIFFERENTIAL/PLATELET - Abnormal; Notable for the following:    Hemoglobin 9.0 (*)    HCT 30.6 (*)    MCV 74.5 (*)    MCH 21.9 (*)    MCHC 29.4 (*)    RDW 17.9 (*)    Platelets 492 (*)    All other components within normal limits  COMPREHENSIVE METABOLIC PANEL - Abnormal; Notable for the following:    Sodium 133 (*)    Chloride 100 (*)    Glucose, Bld 138 (*)    Total Protein 8.2 (*)    All other components within normal limits  URINE MICROSCOPIC-ADD ON - Abnormal; Notable for the following:    Squamous Epithelial / LPF 6-30 (*)    Bacteria, UA MANY (*)    All other components within normal limits  PREGNANCY, URINE  LIPASE, BLOOD    Imaging Review US Abdomen Limited Ruq  09/22/2015  CLINICAL DATA:  Epigastric pain and vomiting for 3 weeks EXAM: US ABDOMEN LIMITED - RIGHT UPPER QUADRANT COMPARISON:  None. FINDINGS: Gallbladder: No gallstones or wall thickening visualized. No sonographic Murphy sign noted by sonographer. Common bile duct: Diameter: 2.5 mm Liver: No focal lesion identified. Within normal limits in parenchymal echogenicity. IMPRESSION: No acute abnormality identified. Electronically Signed   By: Abelardo Diesel M.D.   On: 09/22/2015 17:20   I have personally reviewed and evaluated these images and lab  results as part of my medical decision-making.   EKG Interpretation None      MDM   Final diagnoses:  RUQ abdominal pain  Epigastric pain    45 yo F with a chief complaint of epigastric abdominal pain. This is likely representative of reflux disease. However patient does have pain in the right upper quadrant and has some pain radiating to right shoulder with obtain a right upper quadrant ultrasound.  RUQ ultrasound is negative for gallbladder pathology. Discussed results with the patient. Repeat abdominal exam benign. Will have her follow-up with her gastroenterologist.  5:29 PM:  I have discussed the diagnosis/risks/treatment options with the patient and friend and believe the pt to be eligible for discharge home to follow-up with GI. We also discussed returning to the ED immediately if new or worsening sx occur. We discussed the sx which are most concerning (e.g., sudden worsening pain, fever, inability to tolerate by mouth) that  necessitate immediate return. Medications administered to the patient during their visit and any new prescriptions provided to the patient are listed below.  Medications given during this visit Medications  gi cocktail (Maalox,Lidocaine,Donnatal) (30 mLs Oral Given 09/22/15 1526)  sodium chloride 0.9 % bolus 1,000 mL (0 mLs Intravenous Stopped 09/22/15 1726)  ondansetron (ZOFRAN) injection 4 mg (4 mg Intravenous Given 09/22/15 1553)  morphine 4 MG/ML injection 4 mg (4 mg Intravenous Given 09/22/15 1606)    New Prescriptions   ONDANSETRON (ZOFRAN) 4 MG TABLET    Take 1 tablet (4 mg total) by mouth every 6 (six) hours.    The patient appears reasonably screen and/or stabilized for discharge and I doubt any other medical condition or other [facility] requiring further screening, evaluation, or treatment in the ED at this time prior to discharge.    Deno Etienne, DO 09/22/15 1729

## 2015-09-22 NOTE — ED Notes (Signed)
MD at bedside. 

## 2015-09-22 NOTE — ED Notes (Signed)
Pt having upper epigastric pain for three weeks.  Pt having some vomiting after eating, pain all the time.  Pain will resolve after zantac for one hour but come back .  Pt states her right shoulder has been hurting.  Pain radiating to her back.  Pt noted some greenish stool and increased gas recently.

## 2015-10-11 ENCOUNTER — Telehealth: Payer: Self-pay | Admitting: Medical

## 2015-10-11 NOTE — Telephone Encounter (Signed)
Patient declined receiving flu shot  °

## 2016-07-07 ENCOUNTER — Emergency Department (HOSPITAL_BASED_OUTPATIENT_CLINIC_OR_DEPARTMENT_OTHER)
Admission: EM | Admit: 2016-07-07 | Discharge: 2016-07-08 | Disposition: A | Discharge status: Home / Self Care | Payer: 59 | Attending: Emergency Medicine | Admitting: Emergency Medicine

## 2016-07-07 ENCOUNTER — Encounter (HOSPITAL_BASED_OUTPATIENT_CLINIC_OR_DEPARTMENT_OTHER): Payer: Self-pay | Admitting: *Deleted

## 2016-07-07 ENCOUNTER — Emergency Department (HOSPITAL_BASED_OUTPATIENT_CLINIC_OR_DEPARTMENT_OTHER): Payer: 59

## 2016-07-07 DIAGNOSIS — Y999 Unspecified external cause status: Secondary | ICD-10-CM | POA: Diagnosis not present

## 2016-07-07 DIAGNOSIS — S39012A Strain of muscle, fascia and tendon of lower back, initial encounter: Secondary | ICD-10-CM

## 2016-07-07 DIAGNOSIS — R319 Hematuria, unspecified: Secondary | ICD-10-CM | POA: Insufficient documentation

## 2016-07-07 DIAGNOSIS — X58XXXA Exposure to other specified factors, initial encounter: Secondary | ICD-10-CM | POA: Diagnosis not present

## 2016-07-07 DIAGNOSIS — Y929 Unspecified place or not applicable: Secondary | ICD-10-CM | POA: Diagnosis not present

## 2016-07-07 DIAGNOSIS — Y9389 Activity, other specified: Secondary | ICD-10-CM | POA: Insufficient documentation

## 2016-07-07 DIAGNOSIS — S3992XA Unspecified injury of lower back, initial encounter: Secondary | ICD-10-CM | POA: Diagnosis present

## 2016-07-07 LAB — URINALYSIS, ROUTINE W REFLEX MICROSCOPIC
Bilirubin Urine: NEGATIVE
GLUCOSE, UA: NEGATIVE mg/dL
Ketones, ur: NEGATIVE mg/dL
LEUKOCYTES UA: NEGATIVE
Nitrite: NEGATIVE
PH: 7 (ref 5.0–8.0)
Protein, ur: NEGATIVE mg/dL
Specific Gravity, Urine: 1.009 (ref 1.005–1.030)

## 2016-07-07 LAB — URINE MICROSCOPIC-ADD ON

## 2016-07-07 LAB — PREGNANCY, URINE: PREG TEST UR: NEGATIVE

## 2016-07-07 MED ORDER — NAPROXEN 500 MG PO TABS: 500.0000 mg | ORAL_TABLET | Freq: Two times a day (BID) | ORAL | 0 refills | Status: DC | PRN

## 2016-07-07 MED ORDER — CYCLOBENZAPRINE HCL 10 MG PO TABS
10.0000 mg | ORAL_TABLET | Freq: Once | ORAL | Status: AC
  Administered 2016-07-07: 10 mg via ORAL
  Filled 2016-07-07: qty 1

## 2016-07-07 MED ORDER — CYCLOBENZAPRINE HCL 10 MG PO TABS: 10.0000 mg | ORAL_TABLET | Freq: Two times a day (BID) | ORAL | 0 refills | Status: DC | PRN

## 2016-07-07 MED ORDER — OXYCODONE-ACETAMINOPHEN 5-325 MG PO TABS
1.0000 | ORAL_TABLET | Freq: Once | ORAL | Status: AC
Start: 2016-07-07 — End: 2016-07-07
  Administered 2016-07-07: 1 via ORAL
  Filled 2016-07-07: qty 1

## 2016-07-07 MED ORDER — KETOROLAC TROMETHAMINE 30 MG/ML IJ SOLN
30.0000 mg | Freq: Once | INTRAMUSCULAR | Status: AC
  Administered 2016-07-07: 30 mg via INTRAMUSCULAR
  Filled 2016-07-07: qty 1

## 2016-07-07 NOTE — ED Provider Notes (Signed)
[place] DEPT [facility] Provider Note   CSN: [id number] Arrival date & time: 07/07/16  [id number]   By signing my name below, I, Eunice Blase, attest that this documentation has been prepared under the direction and in the presence of Domenic Moras. Electronically Signed: Eunice Blase, Scribe. 07/07/16. 8:53 PM.   History   Chief Complaint Chief Complaint  Patient presents with  . Back Pain    The history is provided by the patient. No language interpreter was used.   HPI Comments: Yesenia Mills is a 45 y.o. female who presents to the Emergency Department complaining of sudden onset, intermittent, gradual worsening 8/10 low left back pain x 2 days. Pt reports pain started at 1030 after getting out of the shower last night and affected her sleep. Pt reports associated sharp pain radiating down to left hip and up into left shoulder blade onset during movement and gait problems. She states that she took ibuprofen for her pain with little relief. Pt denies fever, headache, lightheadedness, dizziness, numbness, nausea, vomit, SOB, urinary or bowel symptoms, rash, Hx of similar symptoms and activity change. NKDA.   History reviewed. No pertinent past medical history.  Patient Active Problem List   Diagnosis Date Noted  . Epigastric pain 07/20/2015  . Constipation 07/20/2015  . Anemia 07/20/2015  . 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 Pharyngitis 03/28/2013    History reviewed. No pertinent surgical history.  OB History    No data available       Home Medications    Prior to Admission medications   Not on File    Family History Family History  Problem Relation Age of Onset  . Hypertension Mother     Social History Social History  Substance Use Topics  . Smoking status: Never Smoker  . Smokeless tobacco: Never Used  . Alcohol use No     Allergies   Patient has no known allergies.   Review of Systems Review of Systems  Constitutional: Negative for activity  change and fever.  Respiratory: Negative for shortness of breath.   Gastrointestinal: Negative for abdominal pain, blood in stool, nausea and vomiting.  Genitourinary: Negative for decreased urine volume, dysuria, hematuria and urgency.  Musculoskeletal: Positive for arthralgias, back pain, gait problem and myalgias.  Skin: Negative for rash.     Physical Exam Updated Vital Signs BP (!) 177/109 (BP Location: Right Arm)   Pulse 83   Temp 98.2 F (36.8 C) (Oral)   Resp 18   Ht 5' (1.524 m)   Wt 180 lb (81.6 kg)   LMP 07/01/2016 (Exact Date)   SpO2 100%   BMI 35.15 kg/m   Physical Exam  Constitutional: She is oriented to person, place, and time. She appears well-developed and well-nourished.  HENT:  Head: Normocephalic.  Eyes: EOM are normal.  Neck: Normal range of motion.  Cardiovascular: Intact distal pulses.   Pulmonary/Chest: Effort normal.  Abdominal: She exhibits no distension. There is no tenderness.  Musculoskeletal: Normal range of motion.  Tenderness noted to left lumbar paraspinal muscle on palpation without overlying skin changes. Tenderness also noted to left lumbosacral muscle on palpation.  Neurological: She is alert and oriented to person, place, and time.  Psychiatric: She has a normal mood and affect.  Nursing note and vitals reviewed.    ED Treatments / Results  DIAGNOSTIC STUDIES: Oxygen Saturation is 100% on RA, normal by my interpretation.    COORDINATION OF CARE: 8:53 PM Will treat as muscle strain. Discussed possibility of  shingles and kidney stones, and patient advised to return if she experiences hematuria or rash. Will order muscle relaxants and pain medications. Discussed treatment plan with pt at bedside and pt agreed to plan.    Labs (all labs ordered are listed, but only abnormal results are displayed) Labs Reviewed  URINALYSIS, [place] (NOT AT [facility]) - Abnormal; Notable for the following:       Result Value   Hgb  urine dipstick MODERATE (*)    All other components within normal limits  URINE MICROSCOPIC-ADD ON - Abnormal; Notable for the following:    Squamous Epithelial / LPF 0-5 (*)    Bacteria, UA RARE (*)    All other components within normal limits  PREGNANCY, URINE    EKG  EKG Interpretation None       Radiology Ct Renal Stone Study  Result Date: 07/07/2016 CLINICAL DATA:  Left lower back pain radiating to left leg. EXAM: CT ABDOMEN AND PELVIS WITHOUT CONTRAST TECHNIQUE: Multidetector CT imaging of the abdomen and pelvis was performed following the standard protocol without IV contrast. COMPARISON:  July 18, 2015 FINDINGS: Lower chest: No acute abnormality. Hepatobiliary: No focal liver abnormality is seen. No gallstones, gallbladder wall thickening, or biliary dilatation. Pancreas: Unremarkable. No pancreatic ductal dilatation or surrounding inflammatory changes. Spleen: Normal in size without focal abnormality. Adrenals/Urinary Tract: The adrenal glands are normal. An anterior left renal cyst is identified. No renal stones, solid masses, or hydronephrosis. No perinephric stranding. Tiny focus of high attenuation in the distal left ureter, not confirmed on coronal or sagittal images. This is best seen on series 2, image 73. There is no associated ureterectasis for periureteral stranding. Stomach/Bowel: The stomach and small bowel are normal. The colon is normal in appearance. Visualized appendix is normal with no appendicitis. Vascular/Lymphatic: No significant vascular findings are present. No enlarged abdominal or pelvic lymph nodes. Reproductive: Uterus and bilateral adnexa are unremarkable. Other: No abdominal wall hernia or abnormality. No abdominopelvic ascites. Musculoskeletal: No acute or significant osseous findings. IMPRESSION: 1. There is a tiny focus of high attenuation in the distal left ureter on axial images only. This is not confirmed on coronal or sagittal images and artifact  is possible. However, given left-sided pain, a tiny nonobstructing distal left ureteral stone is not excluded. Electronically Signed   By: Dorise Bullion III M.D   On: 07/07/2016 23:38    Procedures Procedures (including critical care time)  Medications Ordered in ED Medications  cyclobenzaprine (FLEXERIL) tablet 10 mg (10 mg Oral Given 07/07/16 2114)  ketorolac (TORADOL) 30 MG/ML injection 30 mg (30 mg Intramuscular Given 07/07/16 2114)  oxyCODONE-acetaminophen (PERCOCET/ROXICET) 5-325 MG per tablet 1 tablet (1 tablet Oral Given 07/07/16 2252)     Initial Impression / Assessment and Plan / ED Course  I have reviewed the triage vital signs and the nursing notes.  Pertinent labs & imaging results that were available during my care of the patient were reviewed by me and considered in my medical decision making (see chart for details).  Clinical Course     Patient with back pain.  No neurological deficits and normal neuro exam.  Patient is ambulatory.  No loss of bowel or bladder control.  No concern for cauda equina.  Doubt dissection. No fever, night sweats, weight loss, h/o cancer, IVDA, no recent procedure to back. No urinary symptoms suggestive of UTI.  Supportive care and return precaution discussed. Appears safe for discharge at this time. Follow up as indicated in  discharge paperwork.    Final Clinical Impressions(s) / ED Diagnoses   Final diagnoses:  Strain of lumbar region, initial encounter    New Prescriptions New Prescriptions   CYCLOBENZAPRINE (FLEXERIL) 10 MG TABLET    Take 1 tablet (10 mg total) by mouth 2 (two) times daily as needed for muscle spasms.   NAPROXEN (NAPROSYN) 500 MG TABLET    Take 1 tablet (500 mg total) by mouth 2 (two) times daily between meals as needed for moderate pain.  I personally performed the services described in this documentation, which was scribed in my presence. The recorded information has been reviewed and is accurate.     10:48  PM Pt here with pain to her L lower back.  Pain is reproducible on exam.  Likely MSK.  She is NVI.  Doubt dissection.  UA with evidence of Hgb in urine.  Has had blood in each of her urinalysis in the past.  Pt denies hematuria, no recent menstruation.  Will obtain CT renal stone study.  Pain medication provided for comfort.  Pt is currently not pregnant.    11:52 PM Preg test is negative.  CT renal stone study demonstrates cyst in L kidney and a possible tiny non obstructive stone at the distal ureter without complicating feature.  I do not think this is what caused pt's pain.  I will give referral to [facility] for outpt f/u of recurrent hematuria.  NSAIDs and Muscle relaxant prescribed for pain.  Return precaution discussed.    Domenic Moras, PA-C 07/07/16 [place], MD 07/08/16 1500

## 2016-07-07 NOTE — ED Triage Notes (Signed)
Pt reports that she began having left lower back pain that radiates down left leg as well as numbness and tingling in left upper leg. Denies losss of bowel or bladder.

## 2016-07-08 NOTE — ED Notes (Signed)
Pt and husband given d/c instructions as per chart. Rx x 2 with precautions. Verbalizes understanding. No questions.

## 2016-07-10 ENCOUNTER — Telehealth: Payer: Self-pay | Admitting: Medical

## 2016-07-10 DIAGNOSIS — Z1211 Encounter for screening for malignant neoplasm of colon: Secondary | ICD-10-CM

## 2016-07-10 NOTE — Telephone Encounter (Signed)
Referral to Gi placed

## 2016-07-10 NOTE — Telephone Encounter (Signed)
Pt bp was elevated in past when I saw her. The other day in ED the bp was higher and maybe associated with pain. So would ask she schedule follow up bp check and to discuss ED visit.   Also on review would like to refer her for screening colonoscopy. Screening colonoscopy recommended for african americans at age 45 year old.

## 2016-07-31 ENCOUNTER — Telehealth: Payer: Self-pay | Admitting: Medical

## 2016-07-31 NOTE — Telephone Encounter (Signed)
Letter mailed to patient.

## 2016-07-31 NOTE — Telephone Encounter (Signed)
Pt has anemia. She never turned in her ifob test. Will you call and ask her to do that. Part of work up for the anemia.

## 2016-09-07 ENCOUNTER — Encounter (HOSPITAL_BASED_OUTPATIENT_CLINIC_OR_DEPARTMENT_OTHER): Payer: Self-pay | Admitting: Emergency Medicine

## 2016-09-07 ENCOUNTER — Emergency Department (HOSPITAL_BASED_OUTPATIENT_CLINIC_OR_DEPARTMENT_OTHER)
Admission: EM | Admit: 2016-09-07 | Discharge: 2016-09-07 | Disposition: A | Discharge status: Home / Self Care | Payer: 59 | Attending: Emergency Medicine | Admitting: Emergency Medicine

## 2016-09-07 ENCOUNTER — Telehealth: Payer: Self-pay | Admitting: Medical

## 2016-09-07 DIAGNOSIS — L509 Urticaria, unspecified: Secondary | ICD-10-CM | POA: Insufficient documentation

## 2016-09-07 DIAGNOSIS — R21 Rash and other nonspecific skin eruption: Secondary | ICD-10-CM | POA: Diagnosis present

## 2016-09-07 LAB — CBC WITH DIFFERENTIAL/PLATELET
Basophils Absolute: 0 10*3/uL (ref 0.0–0.1)
Basophils Relative: 0 %
Eosinophils Absolute: 0.1 10*3/uL (ref 0.0–0.7)
Eosinophils Relative: 1 %
HEMATOCRIT: 27.6 % — AB (ref 36.0–46.0)
HEMOGLOBIN: 7.9 g/dL — AB (ref 12.0–15.0)
LYMPHS PCT: 23 %
Lymphs Abs: 1.6 10*3/uL (ref 0.7–4.0)
MCH: 20.4 pg — AB (ref 26.0–34.0)
MCHC: 28.6 g/dL — ABNORMAL LOW (ref 30.0–36.0)
MCV: 71.1 fL — ABNORMAL LOW (ref 78.0–100.0)
MONOS PCT: 5 %
Monocytes Absolute: 0.3 10*3/uL (ref 0.1–1.0)
NEUTROS ABS: 5 10*3/uL (ref 1.7–7.7)
Neutrophils Relative %: 71 %
Platelets: 496 10*3/uL — ABNORMAL HIGH (ref 150–400)
RBC: 3.88 MIL/uL (ref 3.87–5.11)
RDW: 19.3 % — ABNORMAL HIGH (ref 11.5–15.5)
WBC: 7 10*3/uL (ref 4.0–10.5)

## 2016-09-07 LAB — BASIC METABOLIC PANEL
ANION GAP: 6 (ref 5–15)
BUN: 12 mg/dL (ref 6–20)
CHLORIDE: 105 mmol/L (ref 101–111)
CO2: 26 mmol/L (ref 22–32)
Calcium: 9.3 mg/dL (ref 8.9–10.3)
Creatinine, Ser: 0.89 mg/dL (ref 0.44–1.00)
GFR calc Af Amer: >60 mL/min (ref >60)
GFR calc non Af Amer: >60 mL/min (ref >60)
GLUCOSE: 89 mg/dL (ref 65–99)
POTASSIUM: 3.9 mmol/L (ref 3.5–5.1)
Sodium: 137 mmol/L (ref 135–145)

## 2016-09-07 MED ORDER — METHYLPREDNISOLONE SODIUM SUCC 125 MG IJ SOLR
125.0000 mg | Freq: Once | INTRAMUSCULAR | Status: AC
  Administered 2016-09-07: 125 mg via INTRAVENOUS
  Filled 2016-09-07: qty 2

## 2016-09-07 MED ORDER — HYDROCODONE-ACETAMINOPHEN 5-325 MG PO TABS: 1.0000 | ORAL_TABLET | Freq: Four times a day (QID) | ORAL | 0 refills | Status: DC | PRN

## 2016-09-07 MED ORDER — FAMOTIDINE IN NACL 20-0.9 MG/50ML-% IV SOLN
20.0000 mg | Freq: Once | INTRAVENOUS | Status: AC
  Administered 2016-09-07: 20 mg via INTRAVENOUS
  Filled 2016-09-07: qty 50

## 2016-09-07 MED ORDER — DIPHENHYDRAMINE HCL 50 MG/ML IJ SOLN
25.0000 mg | Freq: Once | INTRAMUSCULAR | Status: AC
  Administered 2016-09-07: 25 mg via INTRAVENOUS
  Filled 2016-09-07: qty 1

## 2016-09-07 MED ORDER — PREDNISONE 10 MG PO TABS: 20.0000 mg | ORAL_TABLET | Freq: Two times a day (BID) | ORAL | 0 refills | Status: DC

## 2016-09-07 MED ORDER — KETOROLAC TROMETHAMINE 30 MG/ML IJ SOLN
30.0000 mg | Freq: Once | INTRAMUSCULAR | Status: AC
  Administered 2016-09-07: 30 mg via INTRAVENOUS
  Filled 2016-09-07: qty 1

## 2016-09-07 MED ORDER — MORPHINE SULFATE (PF) 4 MG/ML IV SOLN
4.0000 mg | Freq: Once | INTRAVENOUS | Status: AC
  Administered 2016-09-07: 4 mg via INTRAVENOUS
  Filled 2016-09-07: qty 1

## 2016-09-07 MED FILL — predniSONE 10 MG TABS: 10 | 3 days supply | Qty: 12 | Fill #0

## 2016-09-07 MED FILL — HYDROCODON-APAP 5-325: 5-325 | 2 days supply | Qty: 10 | Fill #0

## 2016-09-07 NOTE — ED Provider Notes (Signed)
[place] DEPT [facility] Provider Note   CSN: [id number] Arrival date & time: 09/07/16  0745     History   Chief Complaint Chief Complaint  Patient presents with  . Rash    HPI Yesenia Mills is a 46 y.o. female.  Patient is a 46 year old female with no significant past medical history. She presents for evaluation of rash. She started yesterday with itching and redness to both arms, most notably in the antecubital areas and both axillae. She reports swelling to the left arm which is causing pain. She denies any fevers or chills. She denies any new contacts or exposures. She tried taking Benadryl last night, however this did not help.   The history is provided by the patient.  Rash   This is a new problem. The current episode started yesterday. The problem has been rapidly worsening. The problem is associated with nothing. There has been no fever. The pain is moderate. The pain has been constant since onset. Associated symptoms include itching and pain. She has tried antihistamines for the symptoms. The treatment provided no relief.    No past medical history on file.  Patient Active Problem List   Diagnosis Date Noted  . Epigastric pain 07/20/2015  . Constipation 07/20/2015  . Anemia 07/20/2015  . 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 Pharyngitis 03/28/2013    No past surgical history on file.  OB History    No data available       Home Medications    Prior to Admission medications   Medication Sig Start Date End Date Taking? Authorizing Provider  cyclobenzaprine (FLEXERIL) 10 MG tablet Take 1 tablet (10 mg total) by mouth 2 (two) times daily as needed for muscle spasms. 07/07/16   Domenic Moras, PA-C  naproxen (NAPROSYN) 500 MG tablet Take 1 tablet (500 mg total) by mouth 2 (two) times daily between meals as needed for moderate pain. 07/07/16   Domenic Moras, PA-C    Family History Family History  Problem Relation Age of Onset  . Hypertension Mother     Social  History Social History  Substance Use Topics  . Smoking status: Never Smoker  . Smokeless tobacco: Never Used  . Alcohol use No     Allergies   Patient has no known allergies.   Review of Systems Review of Systems  Skin: Positive for itching and rash.  All other systems reviewed and are negative.    Physical Exam Updated Vital Signs BP 169/88 (BP Location: Right Arm)   Pulse 78   Temp 98.1 F (36.7 C) (Oral)   Resp 18   Ht 5' (1.524 m)   Wt 180 lb (81.6 kg)   SpO2 100%   BMI 35.15 kg/m   Physical Exam  Constitutional: She is oriented to person, place, and time. She appears well-developed and well-nourished. No distress.  HENT:  Head: Normocephalic and atraumatic.  Mouth/Throat: Oropharynx is clear and moist.  There is no swelling of the oropharynx or stridor.  Neck: Normal range of motion. Neck supple.  Cardiovascular: Normal rate and regular rhythm.  Exam reveals no gallop and no friction rub.   No murmur heard. Pulmonary/Chest: Effort normal and breath sounds normal. No respiratory distress. She has no wheezes.  Abdominal: Soft. Bowel sounds are normal. She exhibits no distension. There is no tenderness.  Musculoskeletal: Normal range of motion.  Neurological: She is alert and oriented to person, place, and time.  Skin: Skin is warm and dry. She is not diaphoretic.  There is an urticarial rash noted to the right antecubital fossa, left antecubital fossa, distal forearm, and both axillae.  Nursing note and vitals reviewed.    ED Treatments / Results  Labs (all labs ordered are listed, but only abnormal results are displayed) Labs Reviewed - No data to display  EKG  EKG Interpretation None       Radiology No results found.  Procedures Procedures (including critical care time)  Medications Ordered in ED Medications  methylPREDNISolone sodium succinate (SOLU-MEDROL) 125 mg/2 mL injection 125 mg (not administered)  diphenhydrAMINE (BENADRYL)  injection 25 mg (not administered)     Initial Impression / Assessment and Plan / ED Course  I have reviewed the triage vital signs and the nursing notes.  Pertinent labs & imaging results that were available during my care of the patient were reviewed by me and considered in my medical decision making (see chart for details).     Patient presents with complaints of painful, itchy rash. She appears to have hives, however the pain is somewhat inconsistent. Laboratory studies were performed each are essentially unremarkable with the exception of baseline anemia. Her hemoglobin is 7.9. She had been on iron in the past and I will recommend she begin taking this again.  She was given Solu-Medrol, Pepcid, and IV Benadryl. To my exam, the rash appears to be improving. She will be discharged with prednisone and Benadryl.  Final Clinical Impressions(s) / ED Diagnoses   Final diagnoses:  None    New Prescriptions New Prescriptions   No medications on file     Veryl Speak, MD 09/07/16 1224

## 2016-09-07 NOTE — Discharge Instructions (Signed)
Prednisone as prescribed.  Benadryl 50 mg every 8 hours for the next 3 days.  Hydrocodone as prescribed as needed for pain.  Return to the emergency department if you develop swelling of the mouth or neck, difficulty breathing, or an inability to swallow.

## 2016-09-07 NOTE — ED Triage Notes (Signed)
Pt has rash since yesterday.  Noted fine, reddened rash to left wrist, both ac areas, chest and right leg.  Pt states the rash is painful, itching, red and swollen.   Left hand and wrist are more swollen than the right.

## 2016-09-07 NOTE — Telephone Encounter (Signed)
Pt has a long history of anemia and recently seen in ED for rash. Please have her follow up on anemia. Looks like anemic since 2014. Recently little lower than her past levels.

## 2016-09-07 NOTE — Telephone Encounter (Signed)
Please call patient and schedule F/U Ov per provider instructions/SLS 01/19 Thanks.

## 2016-09-07 NOTE — ED Notes (Signed)
Pt requesting pain medication again.

## 2016-09-08 ENCOUNTER — Emergency Department (HOSPITAL_BASED_OUTPATIENT_CLINIC_OR_DEPARTMENT_OTHER)
Admission: EM | Admit: 2016-09-08 | Discharge: 2016-09-08 | Disposition: A | Discharge status: Home / Self Care | Payer: 59 | Attending: Emergency Medicine | Admitting: Emergency Medicine

## 2016-09-08 ENCOUNTER — Encounter (HOSPITAL_BASED_OUTPATIENT_CLINIC_OR_DEPARTMENT_OTHER): Payer: Self-pay | Admitting: *Deleted

## 2016-09-08 DIAGNOSIS — R21 Rash and other nonspecific skin eruption: Secondary | ICD-10-CM | POA: Diagnosis not present

## 2016-09-08 DIAGNOSIS — Z79899 Other long term (current) drug therapy: Secondary | ICD-10-CM | POA: Diagnosis not present

## 2016-09-08 LAB — CBC WITH DIFFERENTIAL/PLATELET
BASOS ABS: 0 10*3/uL (ref 0.0–0.1)
Basophils Relative: 0 %
Eosinophils Absolute: 0 10*3/uL (ref 0.0–0.7)
Eosinophils Relative: 0 %
HCT: 26.7 % — ABNORMAL LOW (ref 36.0–46.0)
Hemoglobin: 7.9 g/dL — ABNORMAL LOW (ref 12.0–15.0)
LYMPHS ABS: 1.2 10*3/uL (ref 0.7–4.0)
Lymphocytes Relative: 8 %
MCH: 20.6 pg — ABNORMAL LOW (ref 26.0–34.0)
MCHC: 29.6 g/dL — ABNORMAL LOW (ref 30.0–36.0)
MCV: 69.7 fL — AB (ref 78.0–100.0)
MONO ABS: 0.6 10*3/uL (ref 0.1–1.0)
MONOS PCT: 4 %
Neutro Abs: 13.5 10*3/uL — ABNORMAL HIGH (ref 1.7–7.7)
Neutrophils Relative %: 88 %
PLATELETS: 511 10*3/uL — AB (ref 150–400)
RBC: 3.83 MIL/uL — ABNORMAL LOW (ref 3.87–5.11)
RDW: 19.4 % — ABNORMAL HIGH (ref 11.5–15.5)
WBC: 15.3 10*3/uL — AB (ref 4.0–10.5)

## 2016-09-08 LAB — BASIC METABOLIC PANEL
Anion gap: 8 (ref 5–15)
BUN: 17 mg/dL (ref 6–20)
CALCIUM: 9.7 mg/dL (ref 8.9–10.3)
CO2: 23 mmol/L (ref 22–32)
CREATININE: 0.99 mg/dL (ref 0.44–1.00)
Chloride: 103 mmol/L (ref 101–111)
GFR calc Af Amer: >60 mL/min (ref >60)
GLUCOSE: 115 mg/dL — AB (ref 65–99)
Potassium: 4.4 mmol/L (ref 3.5–5.1)
SODIUM: 134 mmol/L — AB (ref 135–145)

## 2016-09-08 LAB — C-REACTIVE PROTEIN: CRP: 0.9 mg/dL (ref <1.0)

## 2016-09-08 LAB — SEDIMENTATION RATE: SED RATE: 50 mm/h — AB (ref 0–22)

## 2016-09-08 MED ORDER — TRIAMCINOLONE ACETONIDE 0.1 % EX CREA: 1.0000 "application " | TOPICAL_CREAM | Freq: Two times a day (BID) | CUTANEOUS | 0 refills | Status: DC

## 2016-09-08 MED ORDER — FAMOTIDINE IN NACL 20-0.9 MG/50ML-% IV SOLN
20.0000 mg | Freq: Once | INTRAVENOUS | Status: AC
Start: 2016-09-08 — End: 2016-09-08
  Administered 2016-09-08: 20 mg via INTRAVENOUS
  Filled 2016-09-08: qty 50

## 2016-09-08 MED ORDER — KETOROLAC TROMETHAMINE 30 MG/ML IJ SOLN
15.0000 mg | Freq: Once | INTRAMUSCULAR | Status: AC
  Administered 2016-09-08: 15 mg via INTRAVENOUS
  Filled 2016-09-08: qty 1

## 2016-09-08 MED ORDER — CLINDAMYCIN HCL 150 MG PO CAPS
300.0000 mg | ORAL_CAPSULE | Freq: Once | ORAL | Status: AC
  Administered 2016-09-08: 300 mg via ORAL
  Filled 2016-09-08: qty 2

## 2016-09-08 MED ORDER — OXYCODONE-ACETAMINOPHEN 5-325 MG PO TABS
1.0000 | ORAL_TABLET | Freq: Once | ORAL | Status: AC
  Administered 2016-09-08: 1 via ORAL
  Filled 2016-09-08: qty 1

## 2016-09-08 MED ORDER — ACYCLOVIR 200 MG PO CAPS
800.0000 mg | ORAL_CAPSULE | Freq: Once | ORAL | Status: AC
  Administered 2016-09-08: 800 mg via ORAL
  Filled 2016-09-08: qty 4

## 2016-09-08 MED ORDER — MORPHINE SULFATE (PF) 4 MG/ML IV SOLN
4.0000 mg | Freq: Once | INTRAVENOUS | Status: AC
  Administered 2016-09-08: 4 mg via INTRAVENOUS
  Filled 2016-09-08: qty 1

## 2016-09-08 MED ORDER — CLINDAMYCIN HCL 150 MG PO CAPS: 300.0000 mg | ORAL_CAPSULE | Freq: Four times a day (QID) | ORAL | 0 refills | Status: DC

## 2016-09-08 MED ORDER — ACYCLOVIR 400 MG PO TABS: 800.0000 mg | ORAL_TABLET | Freq: Four times a day (QID) | ORAL | 0 refills | Status: DC

## 2016-09-08 NOTE — ED Notes (Signed)
ED Provider at bedside. 

## 2016-09-08 NOTE — ED Triage Notes (Signed)
Patient states she was seen yesterday for a blistery rash on her left arm, right leg and under her breast.  States the rash has gotten worse over the last 24 hours.

## 2016-09-08 NOTE — ED Notes (Signed)
Pt also c/o HA "7"/10

## 2016-09-08 NOTE — Discharge Instructions (Signed)
Please take the antibiotics as prescribed. Please take the acyclovir as prescribed. He will be notified of the culture results. Please follow-up with a dermatologist. He may apply the steroid cream as prescribed. Return to the ED if you develops any worsening redness, purulent drainage, fevers, nausea, vomiting or for any other reason. You need to follow-up with her primary care doctor to have her blood pressure rechecked.

## 2016-09-08 NOTE — ED Provider Notes (Signed)
[facility] Provider Note   CSN: [id number] Arrival date & time: 09/08/16  1104     History   Chief Complaint Chief Complaint  Patient presents with  . Rash    Recheck    HPI Yesenia Mills is a 46 y.o. female.  46 year old African-American female with no significant past medical history presents to the ED today with complaint of a rash. Patient was seen yesterday in the ED for same. She was told to return if her symptoms worsen. Patient states that the rash has begun to blister on her left wrist. She complains of burning and pain over the blistering of the rash. Patient states that the rash is most notable in her antecubital areas under both axilla left wrist and over the right breast. She denies any new medications prior to onset. She denies any new soaps, detergents, lotions. Patient states that as a burning sensation over the vesicular lesions. She notes some clear fluid discharge. Patient was given steroids and Pepcid in ED yesterday. She was discharged home on prednisone taper. Patient is continue taking the prednisone. She was also given pain medicine. Patient states the pain has continued. Denies any history of ulcers or herpes. Denies any lesions in her mouth or on her genitals. She denies any fever, chills, lightheadedness, dizziness, chest pain, shortness of breath, abdominal pain, nausea, emesis, urinary symptoms, change in bowel habits.        History reviewed. No pertinent past medical history.  Patient Active Problem List   Diagnosis Date Noted  . Epigastric pain 07/20/2015  . Constipation 07/20/2015  . Anemia 07/20/2015  . 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 Pharyngitis 03/28/2013    History reviewed. No pertinent surgical history.  OB History    No data available       Home Medications    Prior to Admission medications   Medication Sig Start Date End Date Taking? Authorizing Provider  HYDROcodone-acetaminophen (NORCO/VICODIN) 5-325  MG tablet Take 1-2 tablets by mouth every 6 (six) hours as needed. 09/07/16  Yes Veryl Speak, MD  predniSONE (DELTASONE) 10 MG tablet Take 2 tablets (20 mg total) by mouth 2 (two) times daily. 09/07/16  Yes Veryl Speak, MD  acyclovir (ZOVIRAX) 400 MG tablet Take 2 tablets (800 mg total) by mouth 4 (four) times daily. 09/08/16   Doristine Devoid, PA-C  clindamycin (CLEOCIN) 150 MG capsule Take 2 capsules (300 mg total) by mouth 4 (four) times daily. 09/08/16   Doristine Devoid, PA-C  triamcinolone cream (KENALOG) 0.1 % Apply 1 application topically 2 (two) times daily. To affected area for no more than 4 days 09/08/16   Doristine Devoid, PA-C    Family History Family History  Problem Relation Age of Onset  . Hypertension Mother     Social History Social History  Substance Use Topics  . Smoking status: Never Smoker  . Smokeless tobacco: Never Used  . Alcohol use No     Allergies   Patient has no known allergies.   Review of Systems Review of Systems  Constitutional: Negative for chills and fever.  HENT: Negative for congestion.   Eyes: Negative for pain, discharge and visual disturbance.  Gastrointestinal: Negative for diarrhea, nausea and vomiting.  Musculoskeletal: Negative for arthralgias and joint swelling.  Skin: Positive for color change and rash.  Neurological: Negative for dizziness, syncope, weakness, light-headedness and headaches.  All other systems reviewed and are negative.    Physical Exam Updated Vital Signs BP 169/86  Pulse 66   Temp 98.4 F (36.9 C) (Oral)   Resp 20   Ht '5\' 1"'  (1.549 m)   Wt 81.6 kg   LMP 08/21/2016   SpO2 98%   BMI 34.01 kg/m   Physical Exam  Constitutional: She is oriented to person, place, and time. She appears well-developed and well-nourished. No distress.  HENT:  Head: Normocephalic and atraumatic.  No lesions noted in the oropharynx. Oropharynx are clear without any erythema or edema or petechiae.  Eyes: Conjunctivae  and EOM are normal. Pupils are equal, round, and reactive to light. Right eye exhibits no discharge. Left eye exhibits no discharge. No scleral icterus.  Conjunctivae are clear with no involvement of vesicular lesions.  Neck: Normal range of motion. Neck supple.  No nuchal rigidity. Full range of motion.  Cardiovascular: Normal rate and regular rhythm.   Pulmonary/Chest: No respiratory distress.  Musculoskeletal: Normal range of motion.  Lymphadenopathy:    She has no cervical adenopathy.  Neurological: She is alert and oriented to person, place, and time.  Skin: Skin is warm and dry. Capillary refill takes less than 2 seconds. No pallor.  Erythema noted to the bilateral antecubital fossa distal left forearm and both axilla. She does have a erythematous rash to the right breast. Rash noted to the left distal forearm has Colace to vesicular lesions without any drainage noted. Mild excoriation is noted. No erythema or edema. No area of induration or fluctuance concerning for abscess. Rash is pruritic.    Nursing note and vitals reviewed.          ED Treatments / Results  Labs (all labs ordered are listed, but only abnormal results are displayed) Labs Reviewed  BASIC METABOLIC PANEL - Abnormal; Notable for the following:       Result Value   Sodium 134 (*)    Glucose, Bld 115 (*)    All other components within normal limits  CBC WITH DIFFERENTIAL/PLATELET - Abnormal; Notable for the following:    WBC 15.3 (*)    RBC 3.83 (*)    Hemoglobin 7.9 (*)    HCT 26.7 (*)    MCV 69.7 (*)    MCH 20.6 (*)    MCHC 29.6 (*)    RDW 19.4 (*)    Platelets 511 (*)    Neutro Abs 13.5 (*)    All other components within normal limits  SEDIMENTATION RATE - Abnormal; Notable for the following:    Sed Rate 50 (*)    All other components within normal limits  AEROBIC CULTURE (SUPERFICIAL SPECIMEN)  HSV CULTURE AND TYPING  C-REACTIVE PROTEIN    EKG  EKG Interpretation None        Radiology No results found.  Procedures Procedures (including critical care time)  Medications Ordered in ED Medications  oxyCODONE-acetaminophen (PERCOCET/ROXICET) 5-325 MG per tablet 1 tablet (1 tablet Oral Given 09/08/16 1522)  acyclovir (ZOVIRAX) 200 MG capsule 800 mg (800 mg Oral Given 09/08/16 1540)  morphine 4 MG/ML injection 4 mg (4 mg Intravenous Given 09/08/16 1633)  ketorolac (TORADOL) 30 MG/ML injection 15 mg (15 mg Intravenous Given 09/08/16 1633)  famotidine (PEPCID) IVPB 20 mg premix (0 mg Intravenous Stopped 09/08/16 1716)  clindamycin (CLEOCIN) capsule 300 mg (300 mg Oral Given 09/08/16 1632)     Initial Impression / Assessment and Plan / ED Course  I have reviewed the triage vital signs and the nursing notes.  Pertinent labs & imaging results that were available during my care of  the patient were reviewed by me and considered in my medical decision making (see chart for details).   patient presents to the ED with worsening rash. She was seen in the ED yesterday for same. She was given steroids with little relief. Patient states that the rash has become vesicular today. Patient states that it is painful and burning sensation. Patient has no history of herpes or zoster. She has no oral or genital lesions. Patient given Toradol and Pepcid in the ED with significant relief in her itching and pain. Patient with a mild leukocytosis of 15 up from a normal white blood cell count yesterday. She is afebrile and not tachycardic. Patient does endorse a mild headache that resolved. Patient has no ocular involvement. Rash is not necessarily follow-up dermatomal pattern however they are vesicular lesions. Concern for possible herpes, zoster's, bullous impetigo, contact dermatitis. Given the patient's white count we'll start patient on antibiotics. She was also started on acyclovir to cover for zoster's. Viral and bacterial cultures were sent. I have given patient dermatology referral. She is  discharged with triamcinolone cream for localized itching. ESR and CRP are pending. Patient is nontoxic appearing. No concern for cellulitis. Patient's blood pressure is elevated. On prior review of vitals patient has had elevated blood pressure. Encourage patient to follow up with her primary care doctor. Patient also has a hemoglobin of 7.9. Stable from yesterday. Patient has a history of anemia. Patient looks to be 7. Encourage patient to follow-up with her primary care doctor for recheck. Vital signs are stable ED. She is hemolytically stable. I encouraged patient to return to ED if she develops any worsening redness, fevers, nausea, vomiting concerning for worsening infection. Patient has been discussed with Dr. Canary Brim who agrees with the above plan. Pt is hemodynamically stable, in NAD, & able to ambulate in the ED. Pain has been managed & has no complaints prior to dc. Pt is comfortable with above plan and is stable for discharge at this time. All questions were answered prior to disposition. Strict return precautions for f/u to the ED were discussed.   Final Clinical Impressions(s) / ED Diagnoses   Final diagnoses:  Rash    New Prescriptions New Prescriptions   ACYCLOVIR (ZOVIRAX) 400 MG TABLET    Take 2 tablets (800 mg total) by mouth 4 (four) times daily.   CLINDAMYCIN (CLEOCIN) 150 MG CAPSULE    Take 2 capsules (300 mg total) by mouth 4 (four) times daily.   TRIAMCINOLONE CREAM (KENALOG) 0.1 %    Apply 1 application topically 2 (two) times daily. To affected area for no more than 4 days     Doristine Devoid, PA-C 09/08/16 [place], MD 09/09/16 [phone]

## 2016-09-08 NOTE — ED Notes (Signed)
Pt made aware to return if symptoms worsen or if any life threatening symptoms occur.   

## 2016-09-08 NOTE — ED Notes (Signed)
Pt took 50mg  of benadryl x 2 last PM, prednisone this AM, and vicodin with no relief.

## 2016-09-10 ENCOUNTER — Encounter: Payer: Self-pay | Admitting: Medical

## 2016-09-10 ENCOUNTER — Ambulatory Visit: Payer: 59 | Admitting: Family Medicine

## 2016-09-10 ENCOUNTER — Emergency Department (HOSPITAL_BASED_OUTPATIENT_CLINIC_OR_DEPARTMENT_OTHER)
Admission: EM | Admit: 2016-09-10 | Discharge: 2016-09-10 | Disposition: A | Discharge status: Home / Self Care | Payer: 59 | Attending: Emergency Medicine | Admitting: Emergency Medicine

## 2016-09-10 ENCOUNTER — Encounter (HOSPITAL_BASED_OUTPATIENT_CLINIC_OR_DEPARTMENT_OTHER): Payer: Self-pay

## 2016-09-10 DIAGNOSIS — Z5321 Procedure and treatment not carried out due to patient leaving prior to being seen by health care provider: Secondary | ICD-10-CM | POA: Diagnosis not present

## 2016-09-10 DIAGNOSIS — R21 Rash and other nonspecific skin eruption: Secondary | ICD-10-CM | POA: Insufficient documentation

## 2016-09-10 NOTE — Telephone Encounter (Signed)
Appt scheduled for 09/12/16

## 2016-09-10 NOTE — ED Triage Notes (Signed)
C/o rash with blisters to inner left wrist x 4 days-seen here twice-no relief-attempted to see PCP was advised no appts and to return to ED-NAD-steady gait

## 2016-09-10 NOTE — Telephone Encounter (Signed)
Pt went to ED and then left. Can you get her in 1 pm on wed or Thursday. Or 8 am on Thursday. Those would be possible less wait time appointment. Want some convenience in light of 3 ED visits recently. Looks like 3rd time left.

## 2016-09-10 NOTE — ED Provider Notes (Cosign Needed)
Patient called to room 3 times from waiting area with no answer and left without being seen after triage. I did not see or evaluate patient patient.    [place], [place] 09/10/16 [phone]

## 2016-09-11 LAB — AEROBIC CULTURE W GRAM STAIN (SUPERFICIAL SPECIMEN): Culture: NORMAL

## 2016-09-11 LAB — AEROBIC CULTURE  (SUPERFICIAL SPECIMEN)

## 2016-09-11 LAB — HSV CULTURE AND TYPING

## 2016-09-11 NOTE — Telephone Encounter (Signed)
Pt scheduled appt for 09/12/16 @ 9am via MyChart.

## 2016-09-12 ENCOUNTER — Ambulatory Visit: Payer: Self-pay | Admitting: Medical

## 2016-09-13 ENCOUNTER — Encounter: Payer: Self-pay | Admitting: Medical

## 2016-09-14 NOTE — Telephone Encounter (Signed)
Patient No Show 09/12/16.

## 2016-10-23 IMAGING — CR DG ABDOMEN 1V
2 series · 2 of 2 positions shown · non-contrast
Comparison: CT abdomen and pelvis of 07/18/2015

CLINICAL DATA: Epigastric pain, nausea, vomiting

EXAM:
ABDOMEN - 1 VIEW

[t abdomen supine (1 of 2)]
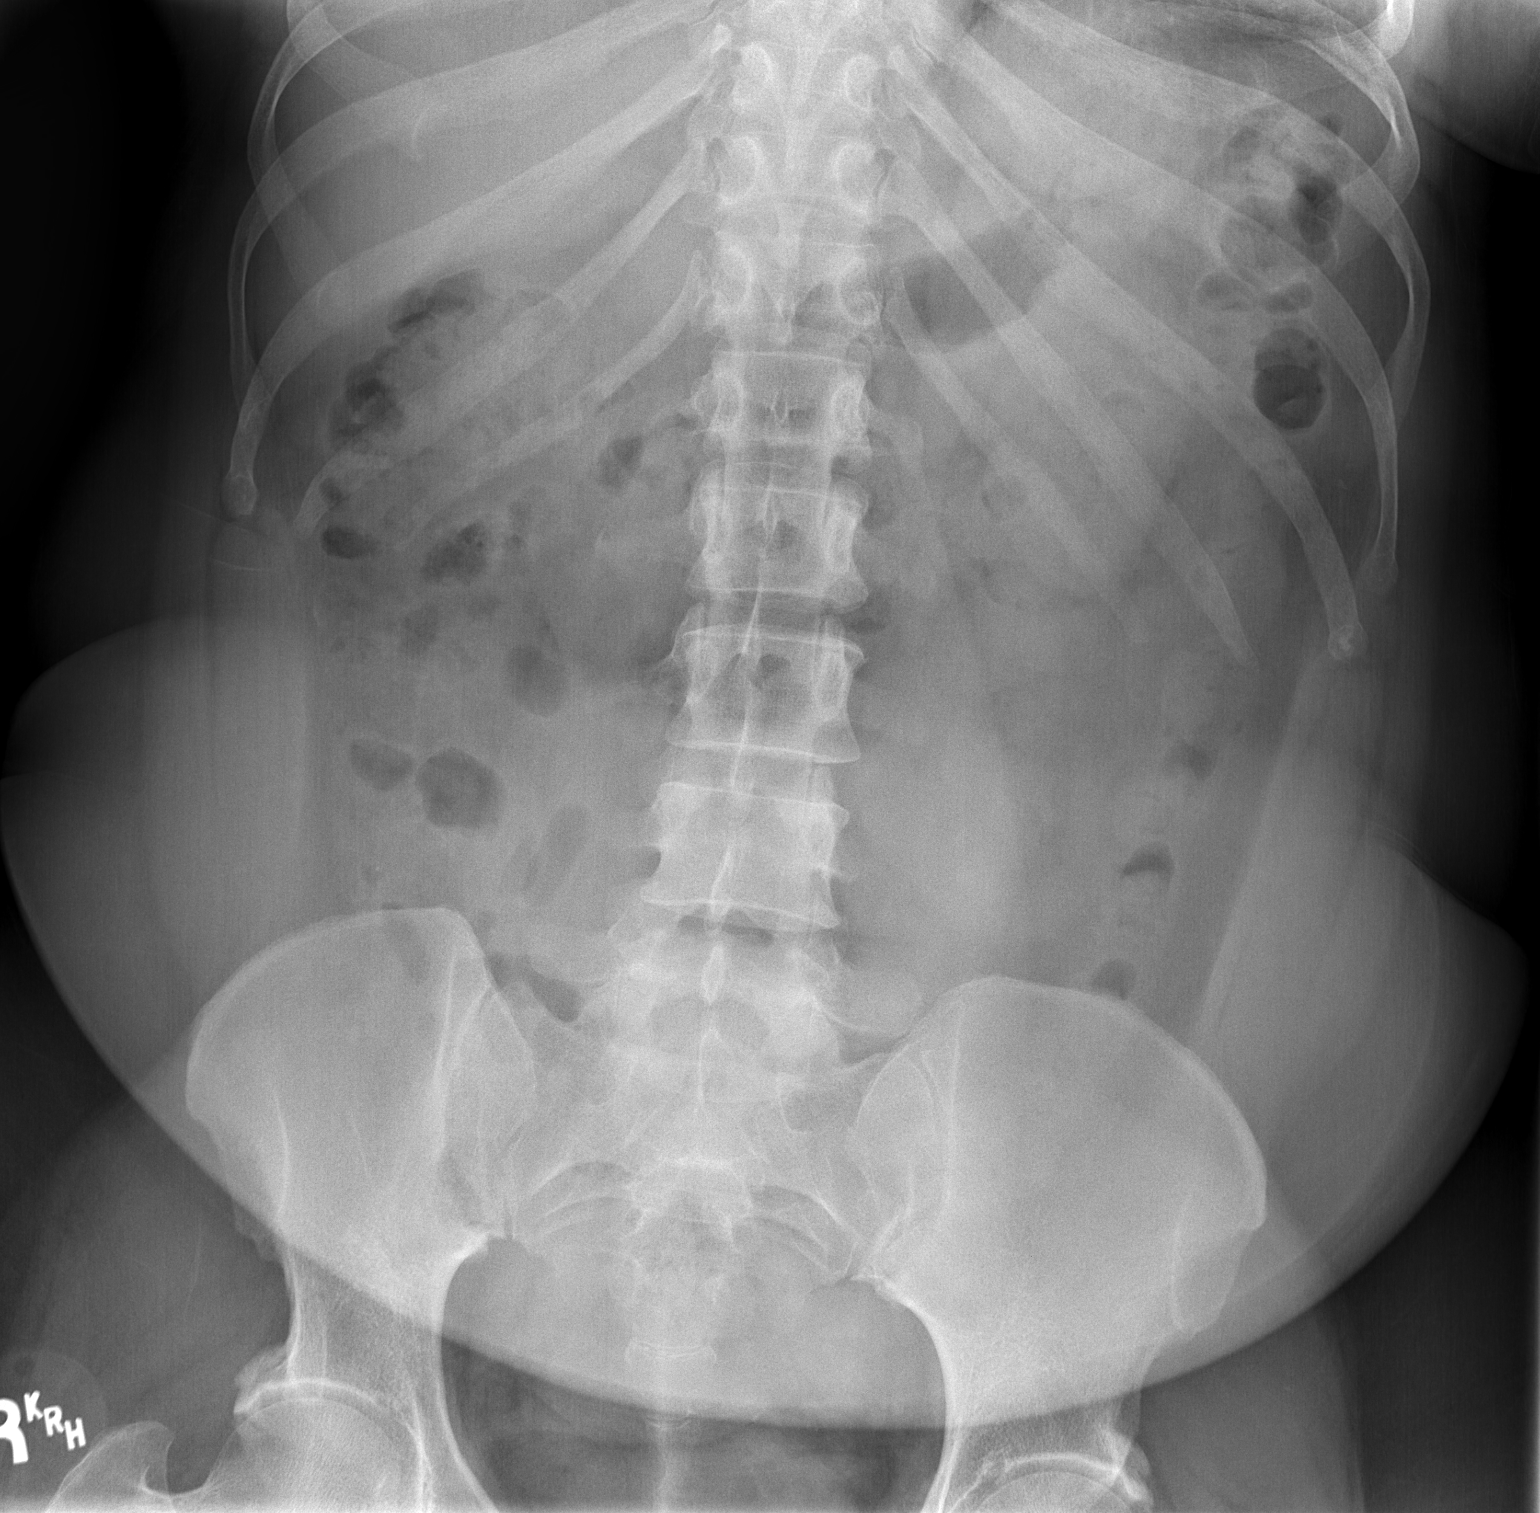

[t abdomen supine (2 of 2)]
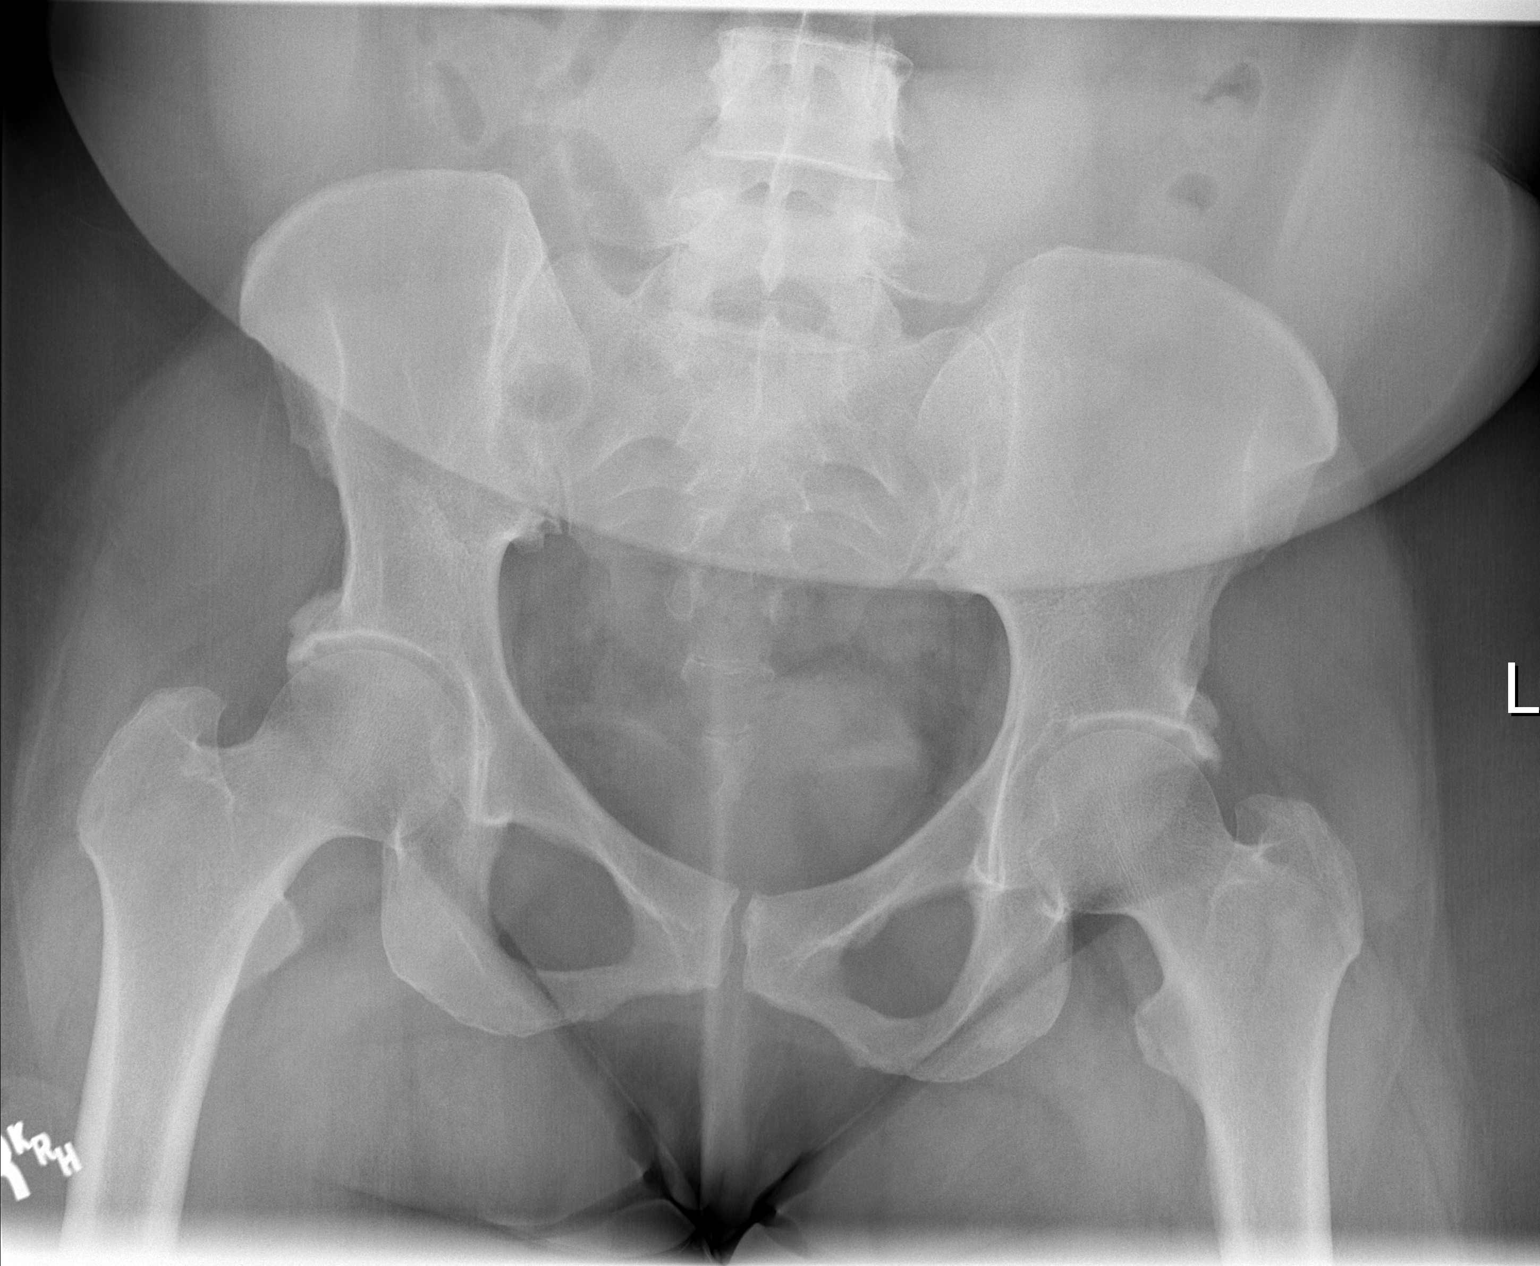

[2 of 2 positions shown; findings below may reference images not displayed]

FINDINGS: Supine views of the abdomen show a moderate amount of feces
throughout the colon. No bowel obstruction is seen. No opaque
calculi are noted. There is no evidence of bowel edema by plain
film.
IMPRESSION: No bowel obstruction. Only a moderate amount of feces throughout the
colon.

## 2017-03-04 ENCOUNTER — Emergency Department (HOSPITAL_BASED_OUTPATIENT_CLINIC_OR_DEPARTMENT_OTHER): Payer: 59

## 2017-03-04 ENCOUNTER — Encounter (HOSPITAL_BASED_OUTPATIENT_CLINIC_OR_DEPARTMENT_OTHER): Payer: Self-pay | Admitting: *Deleted

## 2017-03-04 ENCOUNTER — Emergency Department (HOSPITAL_BASED_OUTPATIENT_CLINIC_OR_DEPARTMENT_OTHER)
Admission: EM | Admit: 2017-03-04 | Discharge: 2017-03-05 | Disposition: A | Discharge status: Home / Self Care | Payer: 59 | Attending: Emergency Medicine | Admitting: Emergency Medicine

## 2017-03-04 DIAGNOSIS — D509 Iron deficiency anemia, unspecified: Secondary | ICD-10-CM | POA: Diagnosis not present

## 2017-03-04 DIAGNOSIS — Z79899 Other long term (current) drug therapy: Secondary | ICD-10-CM | POA: Diagnosis not present

## 2017-03-04 DIAGNOSIS — R072 Precordial pain: Secondary | ICD-10-CM

## 2017-03-04 DIAGNOSIS — R519 Headache, unspecified: Secondary | ICD-10-CM

## 2017-03-04 DIAGNOSIS — R51 Headache: Secondary | ICD-10-CM | POA: Insufficient documentation

## 2017-03-04 DIAGNOSIS — D508 Other iron deficiency anemias: Secondary | ICD-10-CM

## 2017-03-04 DIAGNOSIS — F419 Anxiety disorder, unspecified: Secondary | ICD-10-CM | POA: Diagnosis not present

## 2017-03-04 DIAGNOSIS — R079 Chest pain, unspecified: Secondary | ICD-10-CM | POA: Diagnosis present

## 2017-03-04 HISTORY — DX: Gastritis, unspecified, without bleeding: K29.70

## 2017-03-04 LAB — CBC
HEMATOCRIT: 25.4 % — AB (ref 36.0–46.0)
HEMOGLOBIN: 7.8 g/dL — AB (ref 12.0–15.0)
MCH: 20.7 pg — AB (ref 26.0–34.0)
MCHC: 30.7 g/dL (ref 30.0–36.0)
MCV: 67.4 fL — ABNORMAL LOW (ref 78.0–100.0)
Platelets: 478 10*3/uL — ABNORMAL HIGH (ref 150–400)
RBC: 3.77 MIL/uL — AB (ref 3.87–5.11)
RDW: 20.9 % — ABNORMAL HIGH (ref 11.5–15.5)
WBC: 8.2 10*3/uL (ref 4.0–10.5)

## 2017-03-04 LAB — COMPREHENSIVE METABOLIC PANEL
ALK PHOS: 71 U/L (ref 38–126)
ALT: 16 U/L (ref 14–54)
AST: 28 U/L (ref 15–41)
Albumin: 3.9 g/dL (ref 3.5–5.0)
Anion gap: 7 (ref 5–15)
BILIRUBIN TOTAL: 0.8 mg/dL (ref 0.3–1.2)
BUN: 9 mg/dL (ref 6–20)
CALCIUM: 9.3 mg/dL (ref 8.9–10.3)
CO2: 25 mmol/L (ref 22–32)
CREATININE: 0.98 mg/dL (ref 0.44–1.00)
Chloride: 104 mmol/L (ref 101–111)
GFR calc non Af Amer: >60 mL/min (ref >60)
GLUCOSE: 93 mg/dL (ref 65–99)
Potassium: 3.7 mmol/L (ref 3.5–5.1)
Sodium: 136 mmol/L (ref 135–145)
TOTAL PROTEIN: 8.2 g/dL — AB (ref 6.5–8.1)

## 2017-03-04 LAB — TROPONIN I
Troponin I: <0.03 ng/mL (ref <0.03)
Troponin I: <0.03 ng/mL (ref <0.03)

## 2017-03-04 MED ORDER — IBUPROFEN 400 MG PO TABS
400.0000 mg | ORAL_TABLET | Freq: Once | ORAL | Status: AC
  Administered 2017-03-04: 400 mg via ORAL
  Filled 2017-03-04: qty 1

## 2017-03-04 MED ORDER — FERROUS SULFATE 325 (65 FE) MG PO TABS: 325.0000 mg | ORAL_TABLET | Freq: Three times a day (TID) | ORAL | 0 refills | Status: DC

## 2017-03-04 MED ORDER — ALUM & MAG HYDROXIDE-SIMETH 200-200-20 MG/5ML PO SUSP
15.0000 mL | Freq: Once | ORAL | Status: AC
Start: 2017-03-04 — End: 2017-03-04
  Administered 2017-03-04: 15 mL via ORAL
  Filled 2017-03-04: qty 30

## 2017-03-04 MED ORDER — SODIUM CHLORIDE 0.9 % IV BOLUS (SEPSIS)
500.0000 mL | Freq: Once | INTRAVENOUS | Status: AC
  Administered 2017-03-04: 500 mL via INTRAVENOUS

## 2017-03-04 MED ORDER — LORAZEPAM 2 MG/ML IJ SOLN
1.0000 mg | Freq: Once | INTRAMUSCULAR | Status: AC
  Administered 2017-03-04: 1 mg via INTRAVENOUS
  Filled 2017-03-04: qty 1

## 2017-03-04 MED ORDER — FAMOTIDINE 20 MG PO TABS
20.0000 mg | ORAL_TABLET | Freq: Once | ORAL | Status: AC
  Administered 2017-03-04: 20 mg via ORAL
  Filled 2017-03-04: qty 1

## 2017-03-04 MED ORDER — ACETAMINOPHEN 500 MG PO TABS
1000.0000 mg | ORAL_TABLET | Freq: Once | ORAL | Status: AC
  Administered 2017-03-04: 1000 mg via ORAL
  Filled 2017-03-04: qty 2

## 2017-03-04 MED ORDER — HYDROCODONE-ACETAMINOPHEN 5-325 MG PO TABS
1.0000 | ORAL_TABLET | Freq: Once | ORAL | Status: AC
  Administered 2017-03-04: 1 via ORAL
  Filled 2017-03-04: qty 1

## 2017-03-04 NOTE — ED Notes (Signed)
Pt called out asking for pain medication. Dr. Ashok Cordia made aware

## 2017-03-04 NOTE — Discharge Instructions (Signed)
It was our pleasure to provide your ER care today - we hope that you feel better.  It appears you have iron deficiency anemia - take iron therapy as prescribed.  Rest. Drink adequate fluids.   Follow up with primary care doctor in the next few days for recheck.  For chest discomfort, follow up with cardiologist in 1 week - see referral - call office tomorrow morning to arrange appointment.   Return to ER if worse, new symptoms, fevers, persistent or recurrent chest pain, trouble breathing, severe headache, other concern.   You were given pain medication in the ER - no driving for the next 6 hours.

## 2017-03-04 NOTE — ED Provider Notes (Signed)
[facility] Provider Note   CSN: [id number] Arrival date & time: 03/04/17  1725   By signing my name below, I, Soijett Blue, attest that this documentation has been prepared under the direction and in the presence of Lajean Saver, MD. Electronically Signed: [place], ED Scribe. 03/04/17. 7:09 PM.  History   Chief Complaint Chief Complaint  Patient presents with  . Chest Pain  . Headache    HPI Yesenia Mills is a 46 y.o. female  who presents to the Emergency Department complaining of CP x pressure sensation onset 1 PM today. No pleuritic chest pain. No leg pain or swelling. Denies any unusual doe or fatigue. Denies any exertional cp. Pt reports associated throbbing frontal HA, and vomiting x 1 episode. Headache mild-moderate, was gradual in onset, and milder at onset. No acute/abrupt or thunderclap type head pain. No eye pain or change in vision. No neck pain or stiffness. No numbness/weakness or change in normal functioning.  Pt has not tried any medications for the relief of her symptoms. Pt states that she last felt at her baseline this morning while at work and noticed her symptoms while sitting at her desk. 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 Denies diagnosis of migraines. She states that her father died of a MI at age 19. Pt denies hx of HTN, DM, or high cholesterol. Denies cough, heartburn, eye pain, neck pain, numbness, weakness, LOC, hitting her head, abdominal pain, sinus pain, rhinorrhea, and any other symptoms.    The history is provided by the patient. No language interpreter was used.    Past Medical History:  Diagnosis Date  . Gastritis     Patient Active Problem List   Diagnosis Date Noted  . Epigastric pain 07/20/2015  . Constipation 07/20/2015  . Anemia 07/20/2015  . 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 Pharyngitis 03/28/2013    History reviewed. No pertinent surgical history.  OB History    No data available        Home Medications    Prior to Admission medications   Medication Sig Start Date End Date Taking? Authorizing Provider  Dexlansoprazole (DEXILANT PO) Take by mouth.   Yes [provider]  acyclovir (ZOVIRAX) 400 MG tablet Take 2 tablets (800 mg total) by mouth 4 (four) times daily. 09/08/16   Doristine Devoid, PA-C  clindamycin (CLEOCIN) 150 MG capsule Take 2 capsules (300 mg total) by mouth 4 (four) times daily. 09/08/16   Doristine Devoid, PA-C  HYDROcodone-acetaminophen (NORCO/VICODIN) 5-325 MG tablet Take 1-2 tablets by mouth every 6 (six) hours as needed. 09/07/16   Veryl Speak, MD  predniSONE (DELTASONE) 10 MG tablet Take 2 tablets (20 mg total) by mouth 2 (two) times daily. 09/07/16   Veryl Speak, MD  triamcinolone cream (KENALOG) 0.1 % Apply 1 application topically 2 (two) times daily. To affected area for no more than 4 days 09/08/16   Doristine Devoid, PA-C    Family History Family History  Problem Relation Age of Onset  . Hypertension Mother     Social History Social History  Substance Use Topics  . Smoking status: Never Smoker  . Smokeless tobacco: Never Used  . Alcohol use No     Allergies   Patient has no known allergies.   Review of Systems Review of Systems  Constitutional: Positive for appetite change. Negative for fever.  HENT: Negative for rhinorrhea and sinus pain.   Eyes: Negative for pain and visual disturbance.  Respiratory: Negative for cough and shortness of breath.   Cardiovascular: Positive for chest pain. Negative for leg swelling.  Gastrointestinal: Positive for vomiting. Negative for abdominal pain.  Endocrine: Negative for polyuria.  Genitourinary: Negative for dysuria.  Musculoskeletal: Negative for neck pain and neck stiffness.  Skin: Negative for rash.  Neurological: Positive for headaches. Negative for syncope, weakness and numbness.  Psychiatric/Behavioral: Negative for confusion.     Physical Exam Updated  Vital Signs BP (!) 165/90   Pulse 88   Temp 98.8 F (37.1 C) (Oral)   Resp 16   Ht 5' (1.524 m)   Wt 181 lb (82.1 kg)   LMP 02/04/2017   SpO2 100%   BMI 35.35 kg/m   Physical Exam  Constitutional: She is oriented to person, place, and time. She appears well-developed and well-nourished. No distress.  HENT:  Head: Normocephalic and atraumatic.  Nose: Nose normal.  Mouth/Throat: Oropharynx is clear and moist.  No sinus or temporal tenderness.  Eyes: Pupils are equal, round, and reactive to light. Conjunctivae and EOM are normal. No scleral icterus.  Neck: Normal range of motion. Neck supple. No tracheal deviation present. No thyromegaly present.  No stiffness or rigidity.   Cardiovascular: Normal rate, regular rhythm, normal heart sounds and intact distal pulses.  Exam reveals no gallop and no friction rub.   No murmur heard. Pulmonary/Chest: Effort normal and breath sounds normal. No respiratory distress. She has no wheezes. She has no rales. She exhibits tenderness.  Chest wall tenderness to palpation  Abdominal: Soft. Normal appearance and bowel sounds are normal. She exhibits no distension. There is no tenderness.  Genitourinary:  Genitourinary Comments: No cva tenderness.  Musculoskeletal: Normal range of motion. She exhibits no edema or tenderness.  Neurological: She is alert and oriented to person, place, and time. No cranial nerve deficit.  Speech clear/fluent. Motor intact bilaterally. stre 5/5. No pronator drift. sens grossly intact. Steady gait.   Skin: Skin is warm and dry. No rash noted. She is not diaphoretic.  Psychiatric: She has a normal mood and affect. Her behavior is normal.  Nursing note and vitals reviewed.    ED Treatments / Results  DIAGNOSTIC STUDIES: Oxygen Saturation is 100% on RA, nl by my interpretation.    COORDINATION OF CARE: 7:08 PM Discussed treatment plan with pt at bedside and pt agreed to plan.   Labs (all labs ordered are listed, but  only abnormal results are displayed) Results for orders placed or performed during the hospital encounter of 03/04/17  CBC  Result Value Ref Range   WBC 8.2 4.0 - 10.5 K/uL   RBC 3.77 (L) 3.87 - 5.11 MIL/uL   Hemoglobin 7.8 (L) 12.0 - 15.0 g/dL   HCT 25.4 (L) 36.0 - 46.0 %   MCV 67.4 (L) 78.0 - 100.0 fL   MCH 20.7 (L) 26.0 - 34.0 pg   MCHC 30.7 30.0 - 36.0 g/dL   RDW 20.9 (H) 11.5 - 15.5 %   Platelets 478 (H) 150 - 400 K/uL  Comprehensive metabolic panel  Result Value Ref Range   Sodium 136 135 - 145 mmol/L   Potassium 3.7 3.5 - 5.1 mmol/L   Chloride 104 101 - 111 mmol/L   CO2 25 22 - 32 mmol/L   Glucose, Bld 93 65 - 99 mg/dL   BUN 9 6 - 20 mg/dL   Creatinine, Ser 0.98 0.44 - 1.00 mg/dL   Calcium 9.3 8.9 - 10.3 mg/dL   Total Protein 8.2 (H) 6.5 -  8.1 g/dL   Albumin 3.9 3.5 - 5.0 g/dL   AST 28 15 - 41 U/L   ALT 16 14 - 54 U/L   Alkaline Phosphatase 71 38 - 126 U/L   Total Bilirubin 0.8 0.3 - 1.2 mg/dL   GFR calc non Af Amer >60 >60 mL/min   GFR calc Af Amer >60 >60 mL/min   Anion gap 7 5 - 15  Troponin I  Result Value Ref Range   Troponin I <0.03 <0.03 ng/mL  Troponin I  Result Value Ref Range   Troponin I <0.03 <0.03 ng/mL   Dg Chest 2 View  Result Date: 03/04/2017 CLINICAL DATA:  Initial evaluation for acute dizziness and chest pressure. EXAM: CHEST  2 VIEW COMPARISON:  Prior radiograph from 11/13/2015. FINDINGS: Mild cardiomegaly, stable. Mediastinal silhouette within normal limits. Lungs normally inflated. No focal infiltrates. No pulmonary edema or pleural effusion. No pneumothorax. No acute osseous abnormality. IMPRESSION: 1. No active cardiopulmonary disease. 2. Mild cardiomegaly, stable. Electronically Signed   By: Jeannine Boga M.D.   On: 03/04/2017 19:21    EKG  EKG Interpretation  Date/Time:  Monday March 04 2017 17:35:23 EDT Ventricular Rate:  92 PR Interval:  130 QRS Duration: 86 QT Interval:  364 QTC Calculation: 450 R Axis:   84 Text  Interpretation:  Normal sinus rhythm Nonspecific T wave abnormality No previous tracing Confirmed by Lajean Saver [phone]) on 03/04/2017 6:36:59 PM       Radiology Dg Chest 2 View  Result Date: 03/04/2017 CLINICAL DATA:  Initial evaluation for acute dizziness and chest pressure. EXAM: CHEST  2 VIEW COMPARISON:  Prior radiograph from 11/13/2015. FINDINGS: Mild cardiomegaly, stable. Mediastinal silhouette within normal limits. Lungs normally inflated. No focal infiltrates. No pulmonary edema or pleural effusion. No pneumothorax. No acute osseous abnormality. IMPRESSION: 1. No active cardiopulmonary disease. 2. Mild cardiomegaly, stable. Electronically Signed   By: Jeannine Boga M.D.   On: 03/04/2017 19:21    Procedures Procedures (including critical care time)  Medications Ordered in ED Medications  acetaminophen (TYLENOL) tablet 1,000 mg (1,000 mg Oral Given 03/04/17 1740)     Initial Impression / Assessment and Plan / ED Course  I have reviewed the triage vital signs and the nursing notes.  Pertinent labs & imaging results that were available during my care of the patient were reviewed by me and considered in my medical decision making (see chart for details).  Iv ns. Labs. Hydrocodone for pain.  Cxr.  Reviewed nursing notes and prior charts for additional history.   Pt notes hx anxiety, states felt similar during prior anxiety attack.  Ativan 1 mg iv.  Recheck pt resting comfortably.    Approximately 5.5 hrs post arrival repeat/2nd trop normal/not increasing.    Patient comfortable and appears stable for d/c.  From old charts, hx iron def anemia, hgb stable from prior, not currently on any iron therapy - will prescribe.  rec close pcp f/u.  For chest pain, given fam hx/risk factors, will have f/u cardiology as outpt.  Return precautions provided.   Final Clinical Impressions(s) / ED Diagnoses   Final diagnoses:  None    New Prescriptions New Prescriptions     No medications on file   I personally performed the services described in this documentation, which was scribed in my presence. The recorded information has been reviewed and considered. Lajean Saver, MD     Lajean Saver, MD 03/04/17 [phone]

## 2017-03-04 NOTE — ED Triage Notes (Addendum)
At work today she had a headache. She ate and vomited then experienced sharp pain in her right upper chest that comes and goes. Crying at triage. States she is under a lot of stress.

## 2017-03-12 ENCOUNTER — Encounter (HOSPITAL_BASED_OUTPATIENT_CLINIC_OR_DEPARTMENT_OTHER): Payer: Self-pay | Admitting: *Deleted

## 2017-03-12 ENCOUNTER — Emergency Department (HOSPITAL_BASED_OUTPATIENT_CLINIC_OR_DEPARTMENT_OTHER): Payer: 59

## 2017-03-12 ENCOUNTER — Emergency Department (HOSPITAL_BASED_OUTPATIENT_CLINIC_OR_DEPARTMENT_OTHER)
Admission: EM | Admit: 2017-03-12 | Discharge: 2017-03-12 | Disposition: A | Discharge status: Home / Self Care | Payer: 59 | Attending: Emergency Medicine | Admitting: Emergency Medicine

## 2017-03-12 DIAGNOSIS — R079 Chest pain, unspecified: Secondary | ICD-10-CM | POA: Diagnosis present

## 2017-03-12 DIAGNOSIS — R202 Paresthesia of skin: Secondary | ICD-10-CM | POA: Insufficient documentation

## 2017-03-12 DIAGNOSIS — I161 Hypertensive emergency: Secondary | ICD-10-CM | POA: Diagnosis not present

## 2017-03-12 DIAGNOSIS — Z79899 Other long term (current) drug therapy: Secondary | ICD-10-CM | POA: Insufficient documentation

## 2017-03-12 DIAGNOSIS — M542 Cervicalgia: Secondary | ICD-10-CM | POA: Diagnosis not present

## 2017-03-12 DIAGNOSIS — R51 Headache: Secondary | ICD-10-CM | POA: Diagnosis not present

## 2017-03-12 DIAGNOSIS — R1111 Vomiting without nausea: Secondary | ICD-10-CM | POA: Insufficient documentation

## 2017-03-12 DIAGNOSIS — R0602 Shortness of breath: Secondary | ICD-10-CM | POA: Insufficient documentation

## 2017-03-12 DIAGNOSIS — I10 Essential (primary) hypertension: Secondary | ICD-10-CM

## 2017-03-12 HISTORY — DX: Essential (primary) hypertension: I10

## 2017-03-12 HISTORY — DX: Gastro-esophageal reflux disease without esophagitis: K21.9

## 2017-03-12 LAB — CBC
HCT: 24 % — ABNORMAL LOW (ref 36.0–46.0)
Hemoglobin: 7.2 g/dL — ABNORMAL LOW (ref 12.0–15.0)
MCH: 20.5 pg — ABNORMAL LOW (ref 26.0–34.0)
MCHC: 30 g/dL (ref 30.0–36.0)
MCV: 68.4 fL — AB (ref 78.0–100.0)
PLATELETS: 499 10*3/uL — AB (ref 150–400)
RBC: 3.51 MIL/uL — ABNORMAL LOW (ref 3.87–5.11)
RDW: 20.5 % — AB (ref 11.5–15.5)
WBC: 6.5 10*3/uL (ref 4.0–10.5)

## 2017-03-12 LAB — BASIC METABOLIC PANEL
Anion gap: 8 (ref 5–15)
BUN: 12 mg/dL (ref 6–20)
CALCIUM: 9.4 mg/dL (ref 8.9–10.3)
CHLORIDE: 104 mmol/L (ref 101–111)
CO2: 25 mmol/L (ref 22–32)
CREATININE: 1.06 mg/dL — AB (ref 0.44–1.00)
GFR calc Af Amer: >60 mL/min (ref >60)
Glucose, Bld: 99 mg/dL (ref 65–99)
Potassium: 3.7 mmol/L (ref 3.5–5.1)
SODIUM: 137 mmol/L (ref 135–145)

## 2017-03-12 LAB — TROPONIN I
Troponin I: <0.03 ng/mL (ref <0.03)
Troponin I: <0.03 ng/mL (ref <0.03)

## 2017-03-12 LAB — PREGNANCY, URINE: PREG TEST UR: NEGATIVE

## 2017-03-12 MED ORDER — DIPHENHYDRAMINE HCL 50 MG/ML IJ SOLN
25.0000 mg | Freq: Once | INTRAMUSCULAR | Status: AC
  Administered 2017-03-12: 25 mg via INTRAVENOUS
  Filled 2017-03-12: qty 1

## 2017-03-12 MED ORDER — SODIUM CHLORIDE 0.9 % IV BOLUS (SEPSIS)
1000.0000 mL | Freq: Once | INTRAVENOUS | Status: AC
  Administered 2017-03-12: 1000 mL via INTRAVENOUS

## 2017-03-12 MED ORDER — PROCHLORPERAZINE EDISYLATE 5 MG/ML IJ SOLN
10.0000 mg | Freq: Once | INTRAMUSCULAR | Status: AC
  Administered 2017-03-12: 10 mg via INTRAVENOUS
  Filled 2017-03-12: qty 2

## 2017-03-12 MED ORDER — ASPIRIN 81 MG PO CHEW
324.0000 mg | CHEWABLE_TABLET | Freq: Once | ORAL | Status: DC
  Filled 2017-03-12: qty 4

## 2017-03-12 MED ORDER — ASPIRIN EC 325 MG PO TBEC: 325.0000 mg | DELAYED_RELEASE_TABLET | Freq: Once | ORAL | Status: DC

## 2017-03-12 MED ORDER — IOPAMIDOL (ISOVUE-370) INJECTION 76%
100.0000 mL | Freq: Once | INTRAVENOUS | Status: AC | PRN
  Administered 2017-03-12: 100 mL via INTRAVENOUS

## 2017-03-12 MED ORDER — PROCHLORPERAZINE MALEATE 10 MG PO TABS: 10.0000 mg | ORAL_TABLET | Freq: Once | ORAL | Status: DC

## 2017-03-12 NOTE — ED Notes (Signed)
ED Provider (Resident) at bedside. 

## 2017-03-12 NOTE — ED Notes (Signed)
ED Provider at bedside. 

## 2017-03-12 NOTE — Discharge Instructions (Signed)
Please make sure you follow up with your primary care provider in he next two days for blood pressure check and medication  titration. Your current antihypertensive medication might not be at the right dose for your blood pressure.

## 2017-03-12 NOTE — ED Provider Notes (Signed)
[facility] Provider Note   CSN: [id number] Arrival date & time: 03/12/17  6283     History   Chief Complaint Chief Complaint  Patient presents with  . Chest Pain    HPI Yesenia Mills is a 46 y.o. female with a past medical history significant for HTN, GERD presents today with chest pain. Patient reports yesterday she went to her PCP for headaches after she was seen here in he ED on 7/16 for elevated BP. BP elevated at her PCP office and she was started on Lisinopril 10 mg. Patient took it yesterday night but started to experience pressure in chest. Patient reports one episode of emesis around 2:00 am this morning. Patient continue to endorse headache this morning in addition to chest pain and some shortness of breath, neck pain and tingling in her left hand. Patient reports urinary frequency and was told yesterday at the doctor office that she had some blood in her urine. Patient denies any fever, chills, abdominal pain but endorses some dizziness. HPI  Past Medical History:  Diagnosis Date  . Gastritis   . GERD (gastroesophageal reflux disease)   . Hypertension     Patient Active Problem List   Diagnosis Date Noted  . Epigastric pain 07/20/2015  . Constipation 07/20/2015  . Anemia 07/20/2015  . 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 Pharyngitis 03/28/2013    History reviewed. No pertinent surgical history.  OB History    No data available       Home Medications    Prior to Admission medications   Medication Sig Start Date End Date Taking? Authorizing Provider  Dexlansoprazole (DEXILANT PO) Take by mouth.   Yes [provider]  ferrous sulfate 325 (65 FE) MG tablet Take 1 tablet (325 mg total) by mouth 3 (three) times daily with meals. 03/04/17  Yes Lajean Saver, MD  lisinopril (PRINIVIL,ZESTRIL) 10 MG tablet Take 10 mg by mouth daily.   Yes [provider]  acyclovir (ZOVIRAX) 400 MG tablet Take 2 tablets (800 mg total) by mouth 4  (four) times daily. 09/08/16   Doristine Devoid, PA-C  clindamycin (CLEOCIN) 150 MG capsule Take 2 capsules (300 mg total) by mouth 4 (four) times daily. 09/08/16   Doristine Devoid, PA-C  HYDROcodone-acetaminophen (NORCO/VICODIN) 5-325 MG tablet Take 1-2 tablets by mouth every 6 (six) hours as needed. 09/07/16   Veryl Speak, MD  predniSONE (DELTASONE) 10 MG tablet Take 2 tablets (20 mg total) by mouth 2 (two) times daily. 09/07/16   Veryl Speak, MD  triamcinolone cream (KENALOG) 0.1 % Apply 1 application topically 2 (two) times daily. To affected area for no more than 4 days 09/08/16   Doristine Devoid, PA-C    Family History Family History  Problem Relation Age of Onset  . Hypertension Mother     Social History Social History  Substance Use Topics  . Smoking status: Never Smoker  . Smokeless tobacco: Never Used  . Alcohol use No     Allergies   Patient has no known allergies.   Review of Systems Review of Systems  Constitutional: Negative.   HENT: Negative.   Eyes: Negative.   Respiratory: Positive for chest tightness and shortness of breath.   Cardiovascular: Positive for chest pain.  Gastrointestinal: Negative.   Endocrine: Negative.   Genitourinary: Negative.   Musculoskeletal: Negative.   Skin: Negative.   Allergic/Immunologic: Negative.   Neurological: Positive for dizziness and headaches.  Hematological: Negative.   Psychiatric/Behavioral: Negative.  Physical Exam Updated Vital Signs BP (!) 152/89   Pulse 80   Temp 98.2 F (36.8 C) (Oral)   Resp (!) 22   Ht 5' (1.524 m)   Wt 82.1 kg (181 lb)   LMP 03/05/2017 (Exact Date)   SpO2 98%   BMI 35.35 kg/m   Physical Exam  Constitutional: She is oriented to person, place, and time. She appears well-developed.  HENT:  Head: Normocephalic and atraumatic.  Eyes: Pupils are equal, round, and reactive to light. EOM are normal.  Neck: Normal range of motion. Neck supple.  Cardiovascular: Normal  rate, regular rhythm and normal heart sounds.   Pulmonary/Chest: Effort normal and breath sounds normal.  Abdominal: Soft. Bowel sounds are normal.  Musculoskeletal: Normal range of motion.  Neurological: She is alert and oriented to person, place, and time.  Psychiatric: She has a normal mood and affect.     ED Treatments / Results  Labs (all labs ordered are listed, but only abnormal results are displayed) Labs Reviewed  BASIC METABOLIC PANEL - Abnormal; Notable for the following:       Result Value   Creatinine, Ser 1.06 (*)    All other components within normal limits  CBC - Abnormal; Notable for the following:    RBC 3.51 (*)    Hemoglobin 7.2 (*)    HCT 24.0 (*)    MCV 68.4 (*)    MCH 20.5 (*)    RDW 20.5 (*)    Platelets 499 (*)    All other components within normal limits  TROPONIN I  PREGNANCY, URINE  TROPONIN I    EKG  EKG Interpretation  Date/Time:  Tuesday March 12 2017 09:46:45 EDT Ventricular Rate:  77 PR Interval:    QRS Duration: 92 QT Interval:  411 QTC Calculation: 466 R Axis:   67 Text Interpretation:  Sinus rhythm Probable left atrial enlargement No significant change since last tracing Confirmed by Theotis Burrow [phone]) on 03/12/2017 9:58:25 AM       Radiology Ct Angio Head W Or Wo Contrast  Result Date: 03/12/2017 CLINICAL DATA:  Headache and chest pain beginning last night. Mild shortness of breath and 1 episode of vomiting. Newly diagnosed hypertension. EXAM: CT ANGIOGRAPHY HEAD AND NECK TECHNIQUE: Multidetector CT imaging of the head and neck was performed using the standard protocol during bolus administration of intravenous contrast. Multiplanar CT image reconstructions and MIPs were obtained to evaluate the vascular anatomy. Carotid stenosis measurements (when applicable) are obtained utilizing NASCET criteria, using the distal internal carotid diameter as the denominator. CONTRAST:  100 mL Isovue 370 COMPARISON:  None. FINDINGS: CTA NECK  FINDINGS Aortic arch: Normal variant aortic arch branching pattern with common origin of the brachiocephalic and left common carotid arteries. Widely patent brachiocephalic and subclavian arteries. Right carotid system: Patent without evidence of stenosis, dissection, or significant atherosclerosis. Slightly tortuous mid cervical ICA. Left carotid system: Patent without evidence of stenosis, dissection, or significant atherosclerosis. Vertebral arteries: Patent and codominant without evidence of stenosis, dissection, or significant atherosclerosis. Skeleton: Mild cervical spondylosis. Advanced left facet arthrosis at C4-5. Other neck: Mildly enlarged and nodular thyroid isthmus without a discrete nodule clearly demonstrated by CT. Upper chest: Clear lung apices. Review of the MIP images confirms the above findings CTA HEAD FINDINGS Anterior circulation: The internal carotid arteries are widely patent from skullbase to carotid termini. ACAs and MCAs are patent without evidence of proximal branch occlusion or significant stenosis. No aneurysm. Posterior circulation: The intracranial vertebral arteries are  widely patent to the basilar. Patent bilateral PICA, AICA, and SCA origins are identified. The basilar artery is widely patent. There is a tiny left posterior communicating artery. PCAs are patent without evidence of significant stenosis. No aneurysm. Venous sinuses: Patent. Anatomic variants: None. Delayed phase: No abnormal enhancement. Review of the MIP images confirms the above findings IMPRESSION: 1. Negative head CTA.  No aneurysm. 2. Widely patent cervical carotid and vertebral arteries. Electronically Signed   By: Logan Bores M.D.   On: 03/12/2017 12:13   Ct Head Wo Contrast  Result Date: 03/12/2017 CLINICAL DATA:  Headache EXAM: CT HEAD WITHOUT CONTRAST TECHNIQUE: Contiguous axial images were obtained from the base of the skull through the vertex without intravenous contrast. COMPARISON:  None.  FINDINGS: Brain: No evidence of acute infarction, hemorrhage, hydrocephalus, extra-axial collection or mass lesion/mass effect. Vascular: No hyperdense vessel or unexpected calcification. Skull: Negative Sinuses/Orbits: Mild mucosal in the sphenoid sinus otherwise clear. Negative orbit Other: None IMPRESSION: Negative CT head Electronically Signed   By: Franchot Gallo M.D.   On: 03/12/2017 11:11   Ct Angio Neck W And/or Wo Contrast  Result Date: 03/12/2017 CLINICAL DATA:  Headache and chest pain beginning last night. Mild shortness of breath and 1 episode of vomiting. Newly diagnosed hypertension. EXAM: CT ANGIOGRAPHY HEAD AND NECK TECHNIQUE: Multidetector CT imaging of the head and neck was performed using the standard protocol during bolus administration of intravenous contrast. Multiplanar CT image reconstructions and MIPs were obtained to evaluate the vascular anatomy. Carotid stenosis measurements (when applicable) are obtained utilizing NASCET criteria, using the distal internal carotid diameter as the denominator. CONTRAST:  100 mL Isovue 370 COMPARISON:  None. FINDINGS: CTA NECK FINDINGS Aortic arch: Normal variant aortic arch branching pattern with common origin of the brachiocephalic and left common carotid arteries. Widely patent brachiocephalic and subclavian arteries. Right carotid system: Patent without evidence of stenosis, dissection, or significant atherosclerosis. Slightly tortuous mid cervical ICA. Left carotid system: Patent without evidence of stenosis, dissection, or significant atherosclerosis. Vertebral arteries: Patent and codominant without evidence of stenosis, dissection, or significant atherosclerosis. Skeleton: Mild cervical spondylosis. Advanced left facet arthrosis at C4-5. Other neck: Mildly enlarged and nodular thyroid isthmus without a discrete nodule clearly demonstrated by CT. Upper chest: Clear lung apices. Review of the MIP images confirms the above findings CTA HEAD  FINDINGS Anterior circulation: The internal carotid arteries are widely patent from skullbase to carotid termini. ACAs and MCAs are patent without evidence of proximal branch occlusion or significant stenosis. No aneurysm. Posterior circulation: The intracranial vertebral arteries are widely patent to the basilar. Patent bilateral PICA, AICA, and SCA origins are identified. The basilar artery is widely patent. There is a tiny left posterior communicating artery. PCAs are patent without evidence of significant stenosis. No aneurysm. Venous sinuses: Patent. Anatomic variants: None. Delayed phase: No abnormal enhancement. Review of the MIP images confirms the above findings IMPRESSION: 1. Negative head CTA.  No aneurysm. 2. Widely patent cervical carotid and vertebral arteries. Electronically Signed   By: Logan Bores M.D.   On: 03/12/2017 12:13    Procedures Procedures (including critical care time)  Medications Ordered in ED Medications  diphenhydrAMINE (BENADRYL) injection 25 mg (25 mg Intravenous Given 03/12/17 1112)  sodium chloride 0.9 % bolus 1,000 mL (0 mLs Intravenous Stopped 03/12/17 1242)  prochlorperazine (COMPAZINE) injection 10 mg (10 mg Intravenous Given 03/12/17 1114)  iopamidol (ISOVUE-370) 76 % injection 100 mL (100 mLs Intravenous Contrast Given 03/12/17 1141)     Initial  Impression / Assessment and Plan / ED Course  I have reviewed the triage vital signs and the nursing notes.  Pertinent labs & imaging results that were available during my care of the patient were reviewed by me and considered in my medical decision making (see chart for details).     Patient is 46 yo female who presents with intractable headaches, chest pain, mild SOB in the setting elevated BP ( 179/94) concerning for hypertensive emergency vs ACS. Troponin has been <0.03 x2. Head CT was negative for any acute findings. Given intractable HA, elevated BP  for the past two weeks and neck pain there were concerns for  dissection or aneurysm. CTA head and neck was negative for any acute findings. Patient was given compazine and benadryl for her headaches with significant improvement. BP was much improved on discharge. Patient will follow up with PCP with new diagnosis of HTN, patient would benefit from antihypertensive meds titration.  Final Clinical Impressions(s) / ED Diagnoses   Final diagnoses:  Essential hypertension  Hypertensive emergency  Chest pain, unspecified type    New Prescriptions New Prescriptions   No medications on file     Marjie Skiff, MD 03/12/17 [place], Wenda Overland, MD 03/19/17 [phone]

## 2017-03-12 NOTE — ED Notes (Signed)
Patient transported to CT 

## 2017-03-12 NOTE — ED Triage Notes (Signed)
Pt reports seeing PCP yesterday and received new diagnosis of HTN. Reports beginning Lisinopril and developed chest pain last night with associated headache. Reports mild sob (pt able to speak in complete sentences). Reports vomiting x1 last night.

## 2017-03-18 ENCOUNTER — Encounter: Payer: Self-pay | Admitting: Medical

## 2017-03-20 ENCOUNTER — Telehealth: Payer: Self-pay | Admitting: Medical

## 2017-03-20 NOTE — Telephone Encounter (Signed)
Patient dismissed from [facility] by Zane Herald, effective 03/18/2017. Dismissal letter sent out by certified / registered mail. LM

## 2017-03-27 NOTE — Telephone Encounter (Signed)
Received signed domestic return receipt verifying delivery of certified letter on March 23, 2017. Article number [id number] [phone] [id number]

## 2017-05-14 ENCOUNTER — Emergency Department (HOSPITAL_BASED_OUTPATIENT_CLINIC_OR_DEPARTMENT_OTHER)
Admission: EM | Admit: 2017-05-14 | Discharge: 2017-05-14 | Disposition: A | Discharge status: Home / Self Care | Payer: 59 | Attending: Emergency Medicine | Admitting: Emergency Medicine

## 2017-05-14 ENCOUNTER — Encounter (HOSPITAL_BASED_OUTPATIENT_CLINIC_OR_DEPARTMENT_OTHER): Payer: Self-pay | Admitting: *Deleted

## 2017-05-14 DIAGNOSIS — Z79899 Other long term (current) drug therapy: Secondary | ICD-10-CM | POA: Insufficient documentation

## 2017-05-14 DIAGNOSIS — R51 Headache: Secondary | ICD-10-CM | POA: Insufficient documentation

## 2017-05-14 DIAGNOSIS — R040 Epistaxis: Secondary | ICD-10-CM | POA: Diagnosis not present

## 2017-05-14 DIAGNOSIS — I10 Essential (primary) hypertension: Secondary | ICD-10-CM | POA: Diagnosis not present

## 2017-05-14 DIAGNOSIS — R519 Headache, unspecified: Secondary | ICD-10-CM

## 2017-05-14 LAB — PREGNANCY, URINE: Preg Test, Ur: NEGATIVE

## 2017-05-14 MED ORDER — METOCLOPRAMIDE HCL 5 MG/ML IJ SOLN
10.0000 mg | Freq: Once | INTRAMUSCULAR | Status: AC
  Administered 2017-05-14: 10 mg via INTRAVENOUS
  Filled 2017-05-14: qty 2

## 2017-05-14 MED ORDER — KETOROLAC TROMETHAMINE 30 MG/ML IJ SOLN
30.0000 mg | Freq: Once | INTRAMUSCULAR | Status: AC
  Administered 2017-05-14: 30 mg via INTRAVENOUS
  Filled 2017-05-14: qty 1

## 2017-05-14 MED ORDER — DIPHENHYDRAMINE HCL 50 MG/ML IJ SOLN
25.0000 mg | Freq: Once | INTRAMUSCULAR | Status: AC
  Administered 2017-05-14: 25 mg via INTRAVENOUS
  Filled 2017-05-14: qty 1

## 2017-05-14 MED ORDER — PROCHLORPERAZINE EDISYLATE 5 MG/ML IJ SOLN
10.0000 mg | Freq: Once | INTRAMUSCULAR | Status: AC
  Administered 2017-05-14: 10 mg via INTRAVENOUS
  Filled 2017-05-14: qty 2

## 2017-05-14 MED ORDER — SODIUM CHLORIDE 0.9 % IV BOLUS (SEPSIS)
1000.0000 mL | Freq: Once | INTRAVENOUS | Status: AC
  Administered 2017-05-14: 1000 mL via INTRAVENOUS

## 2017-05-14 NOTE — Discharge Instructions (Signed)
Do not take any ibuprofen tonight as a medication we gave him some ibuprofen and it.  Follow-up with her primary care doctor next 24-48 hours for further evaluation.  As we discussed, if you have another nosebleed, apply pressure to the nose.  Return to the emergency department for any worsening headache, vision changes, chest pain, difficulty breathing, returning nosebleed, [place] LOC, numbness/weakness of her arms or legs or any other worsening or concerning symptoms.

## 2017-05-14 NOTE — ED Triage Notes (Signed)
Headache, nose bleed for 10 minutes. No bleeding on arrival to triage.

## 2017-05-14 NOTE — ED Notes (Signed)
ED Provider at bedside. 

## 2017-05-14 NOTE — ED Provider Notes (Signed)
[facility] Provider Note   CSN: [id number] Arrival date & time: 05/14/17  1518     History   Chief Complaint Chief Complaint  Patient presents with  . Headache  . Epistaxis    HPI Yesenia Mills is a 46 y.o. female with PMH/o HTN, GERD who presents with bilateral temporal headache and epistaxis that began at approximately 2pm this afternoon. Patient reports that she was at work when she started experiencing a gradual bilateral temporal headache that stretches across her forehead. She denies any thunderclap headache. She denies any preceding trauma, injury, fall. She states she did not take any medications for the headache. Patient states that she often gets headaches when her blood pressure is high. She reports that shortly after she began experiencing epistaxis from her left nare. Patient reports that she went to the bathroom and started spitting up blood clots which made her very worried. Patient states that she was not coughing or vomiting up blood. She reports that she applied pressure and the nosebleed resolved on its own in approximately 20 minutes. Patient reports that on ED arrival, epistaxis has resolved but her headache is still present. Patient reports feeling generalized weakness but no focal weakness. Patient denies any fevers, congestions, rhinorrhea, sore throat, CP, SOB, abdominal pain, vomiting, dysuria, hematuria, vision changes, numbness/weakness in her extremities, gait abnormalities.   The history is provided by the patient.    Past Medical History:  Diagnosis Date  . Gastritis   . GERD (gastroesophageal reflux disease)   . Hypertension     Patient Active Problem List   Diagnosis Date Noted  . Epigastric pain 07/20/2015  . Constipation 07/20/2015  . Anemia 07/20/2015  . 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 Pharyngitis 03/28/2013    History reviewed. No pertinent surgical history.  OB History    No data available       Home  Medications    Prior to Admission medications   Medication Sig Start Date End Date Taking? Authorizing Provider  acyclovir (ZOVIRAX) 400 MG tablet Take 2 tablets (800 mg total) by mouth 4 (four) times daily. 09/08/16   Doristine Devoid, PA-C  clindamycin (CLEOCIN) 150 MG capsule Take 2 capsules (300 mg total) by mouth 4 (four) times daily. 09/08/16   Doristine Devoid, PA-C  Dexlansoprazole (DEXILANT PO) Take by mouth.    [provider]  ferrous sulfate 325 (65 FE) MG tablet Take 1 tablet (325 mg total) by mouth 3 (three) times daily with meals. 03/04/17   Lajean Saver, MD  HYDROcodone-acetaminophen (NORCO/VICODIN) 5-325 MG tablet Take 1-2 tablets by mouth every 6 (six) hours as needed. 09/07/16   Veryl Speak, MD  lisinopril (PRINIVIL,ZESTRIL) 10 MG tablet Take 10 mg by mouth daily.    [provider]  predniSONE (DELTASONE) 10 MG tablet Take 2 tablets (20 mg total) by mouth 2 (two) times daily. 09/07/16   Veryl Speak, MD  triamcinolone cream (KENALOG) 0.1 % Apply 1 application topically 2 (two) times daily. To affected area for no more than 4 days 09/08/16   Doristine Devoid, PA-C    Family History Family History  Problem Relation Age of Onset  . Hypertension Mother     Social History Social History  Substance Use Topics  . Smoking status: Never Smoker  . Smokeless tobacco: Never Used  . Alcohol use No     Allergies   Patient has no known allergies.   Review of Systems Review of Systems  Constitutional: Negative  for chills and fever.  HENT: Positive for nosebleeds. Negative for congestion and sore throat.   Eyes: Negative for visual disturbance.  Respiratory: Negative for cough and shortness of breath.   Cardiovascular: Negative for chest pain.  Gastrointestinal: Negative for abdominal pain, nausea and vomiting.  Genitourinary: Negative for dysuria and hematuria.  Neurological: Positive for weakness (generalized) and headaches. Negative for  dizziness and numbness.  Psychiatric/Behavioral: Negative for confusion.     Physical Exam Updated Vital Signs BP 136/84 (BP Location: Right Arm)   Pulse 70   Temp 97.8 F (36.6 C) (Oral)   Resp 16   Ht 5' (1.524 m)   Wt 81.6 kg (180 lb)   LMP 05/07/2017   SpO2 97%   BMI 35.15 kg/m   Physical Exam  Constitutional: She is oriented to person, place, and time. She appears well-developed and well-nourished.  Appears anxious but no acute distress  HENT:  Head: Normocephalic and atraumatic.  Nose: No nasal septal hematoma. No epistaxis.  Mouth/Throat: Oropharynx is clear and moist and mucous membranes are normal.  Anterior aspect of right naris shows dried blood with scaly, dry mucosal lining. No active epistaxis at this time. No tenderness to palpation of the skull. No deformity or crepitus.   Eyes: Pupils are equal, round, and reactive to light. Conjunctivae, EOM and lids are normal.  Neck: Full passive range of motion without pain.  Full flexion/extension and lateral movement of neck fully intact. No bony midline tenderness. No deformities or crepitus.   Cardiovascular: Normal rate, regular rhythm, normal heart sounds and normal pulses.  Exam reveals no gallop and no friction rub.   No murmur heard. Pulmonary/Chest: Effort normal and breath sounds normal.  No evidence of respiratory distress. Able to speak in full sentences without difficulty.  Abdominal: Soft. Normal appearance. There is no tenderness. There is no rigidity and no guarding.  Musculoskeletal: Normal range of motion.  Neurological: She is alert and oriented to person, place, and time. GCS eye subscore is 4. GCS verbal subscore is 5. GCS motor subscore is 6.  Cranial nerves III-XII intact Follows commands, Moves all extremities  5/5 strength to BUE and BLE  Sensation intact throughout all major nerve distributions Normal finger to nose. No dysdiadochokinesia. No pronator drift. No gait abnormalities  No slurred  speech. No facial droop.   Skin: Skin is warm and dry. Capillary refill takes less than 2 seconds.  Psychiatric: She has a normal mood and affect. Her speech is normal.  Nursing note and vitals reviewed.    ED Treatments / Results  Labs (all labs ordered are listed, but only abnormal results are displayed) Labs Reviewed  PREGNANCY, URINE    EKG  EKG Interpretation None       Radiology No results found.  Procedures Procedures (including critical care time)  Medications Ordered in ED Medications  sodium chloride 0.9 % bolus 1,000 mL (0 mLs Intravenous Stopped 05/14/17 1858)  ketorolac (TORADOL) 30 MG/ML injection 30 mg (30 mg Intravenous Given 05/14/17 1707)  metoCLOPramide (REGLAN) injection 10 mg (10 mg Intravenous Given 05/14/17 1707)  diphenhydrAMINE (BENADRYL) injection 25 mg (25 mg Intravenous Given 05/14/17 1707)  prochlorperazine (COMPAZINE) injection 10 mg (10 mg Intravenous Given 05/14/17 1707)     Initial Impression / Assessment and Plan / ED Course  I have reviewed the triage vital signs and the nursing notes.  Pertinent labs & imaging results that were available during my care of the patient were reviewed by me and considered  in my medical decision making (see chart for details).     46 y.o. F who presents with temporal/frontal headache and epistaxis that began at 2pm. No active nosebleed now. No numbness/weakness, vision changes, CP, SOB. Patient is afebrile, non-toxic appearing, sitting comfortably on examination table.Vital signs reviewed and stable. Patient slightly hypertensive, likely secondary to pain. Do not suspect hypertensive emergency. No active episxtaxis at this time. No neuro deficits. Consider tension headache. Do not suspect ICH, meningitis, carotid dissection, cavernous sinus thrombosis. Plan to give migraine cocktail and fluids and reassess.   Reevaluation. Patient reports improvement in headache. Is 4/10. Will plan to PO challenge and ambulate  patient.  Reevaluation. Patient reports headache has completely resolved. She has not had any more nosebleeds since being in the department. She has been able to tolerate by mouth without any difficulty. She is injury to the bathroom without any difficulty. Reevaluation shows no abdominal pain. Vital signs reviewed. Blood pressure has improved. Patient is stable for discharge at this time. Instructed patient to follow-up with PCP in 2 days. Strict return precautions discussed. Patient expresses understanding and agreement to plan.    Final Clinical Impressions(s) / ED Diagnoses   Final diagnoses:  Acute nonintractable headache, unspecified headache type  Epistaxis    New Prescriptions New Prescriptions   No medications on file     Desma Mcgregor 05/15/17 0130    Isla Pence, MD 05/19/17 1515

## 2017-05-14 NOTE — ED Notes (Signed)
Water given to Pt for the fluid chanllenge

## 2018-06-10 IMAGING — CT CT HEAD W/O CM
3 series · 16 of 47 positions shown, 19 images · non-contrast
Comparison: None.

CLINICAL DATA: Headache

EXAM:
CT HEAD WITHOUT CONTRAST
TECHNIQUE: Contiguous axial images were obtained from the base of the skull
through the vertex without intravenous contrast.

[Series 2: head wo · axial · 0.39mm/px · z∈[-160,-20]mm · 10 of 34 slices shown, 13 images]
[im 3/34  brain]
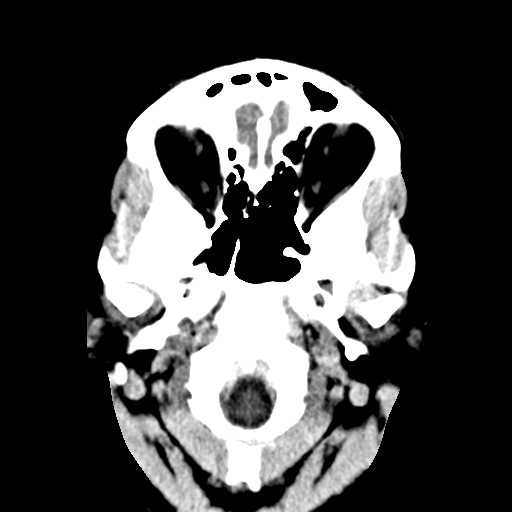
[im 3/34  bone]
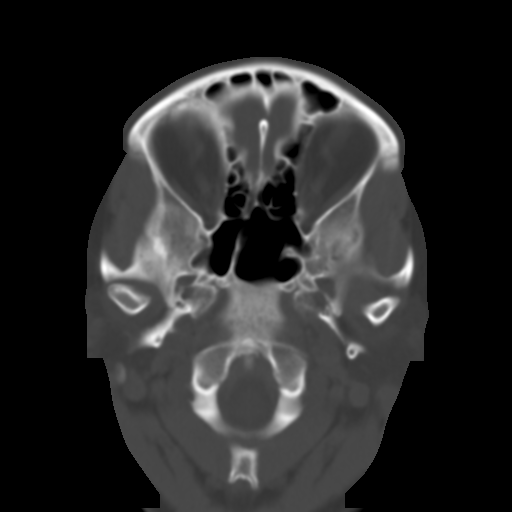
[im 6/34  brain]
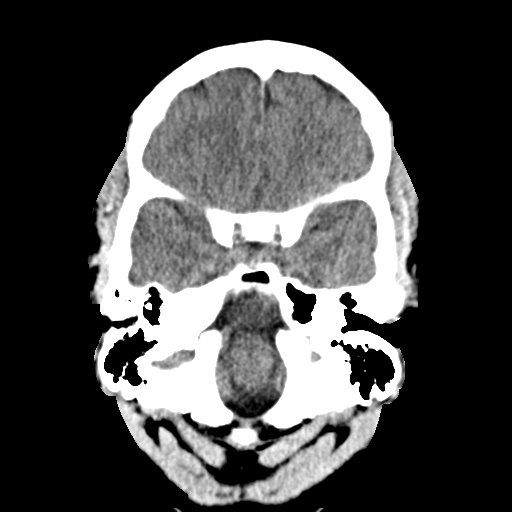
[im 10/34  brain]
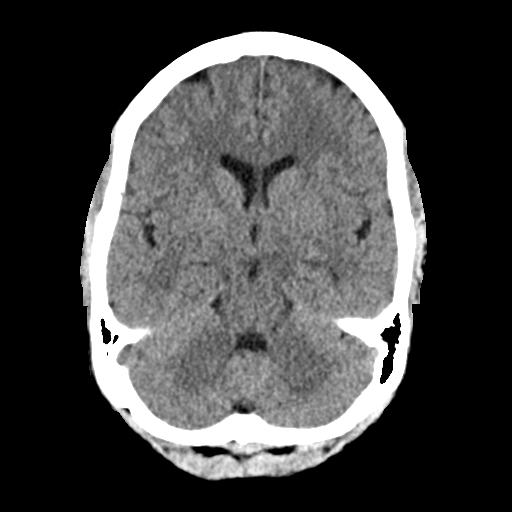
[im 12/34  brain]
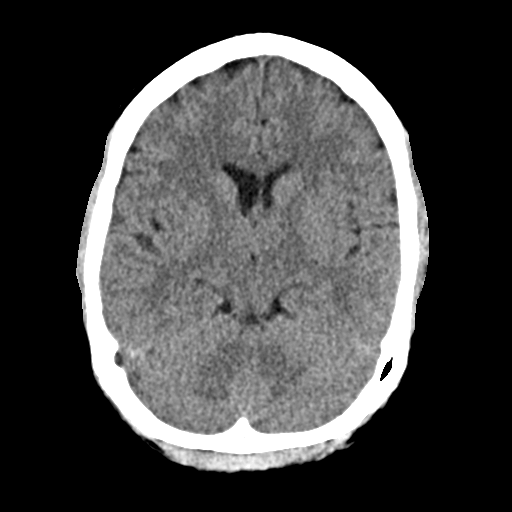
[im 15/34  brain]
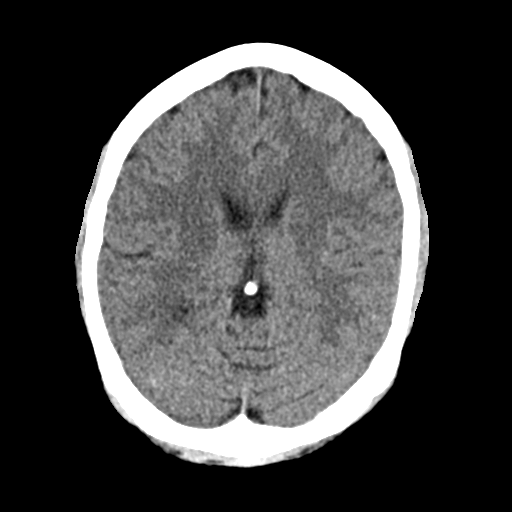
[im 15/34  bone]
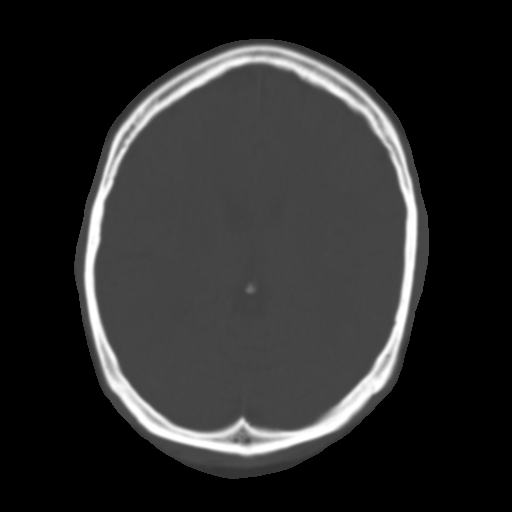
[im 19/34  brain]
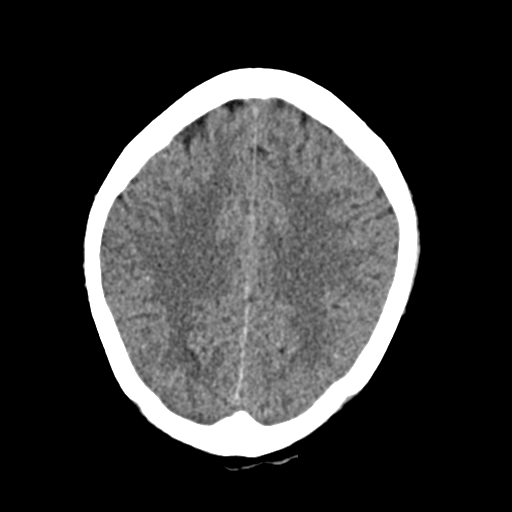
[im 22/34  brain]
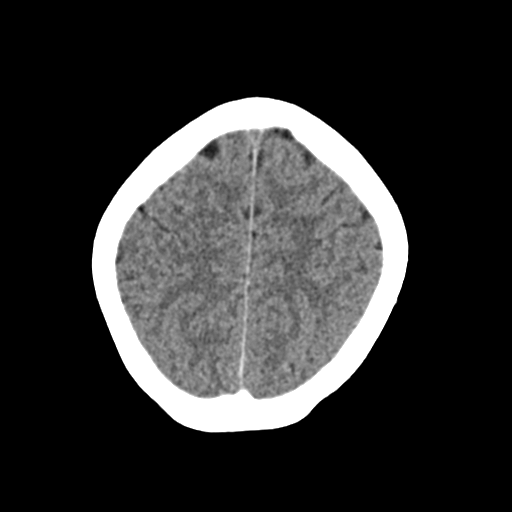
[im 26/34  brain]
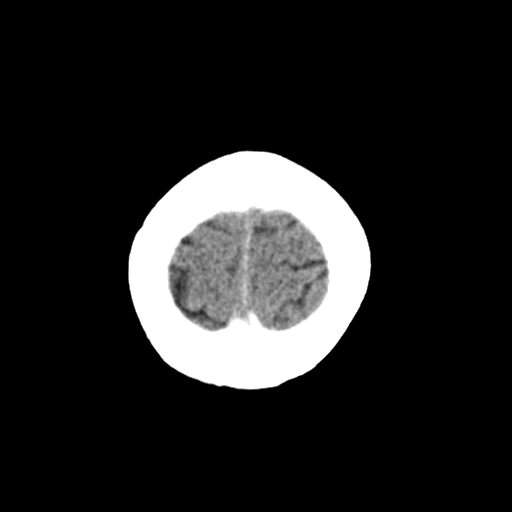
[im 28/34  brain]
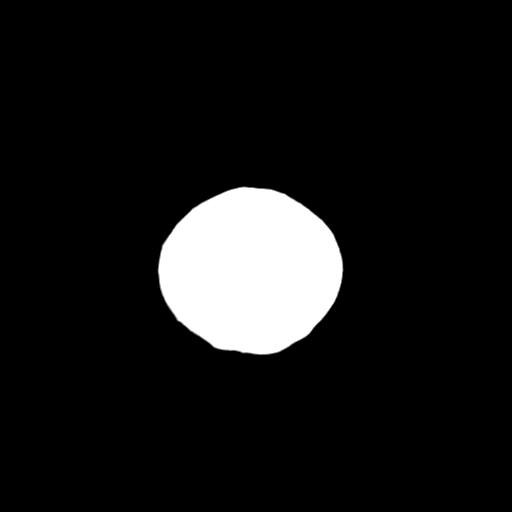
[im 28/34  bone]
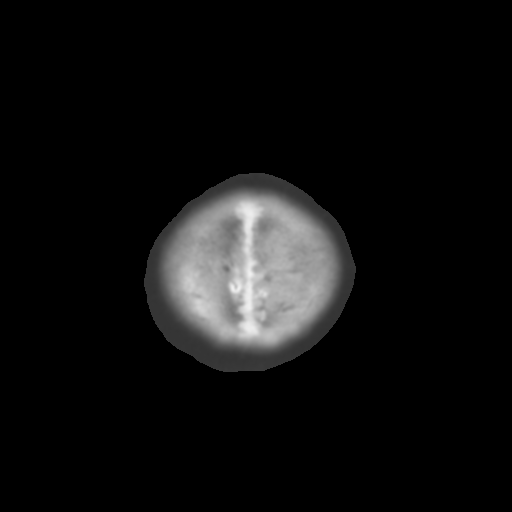
[im 31/34  brain]
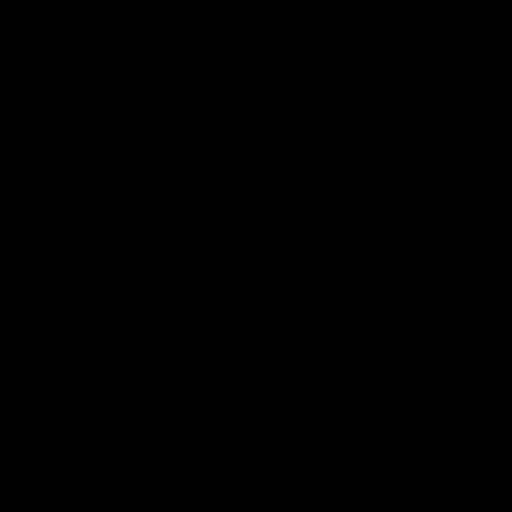

[Series 4: coronal soft · coronal · 0.33mm/px · 3 of 60 slices shown]
[im 20/60  brain]
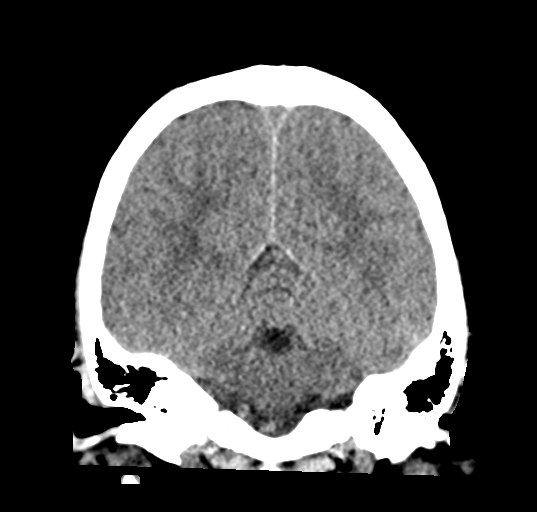
[im 27/60  brain]
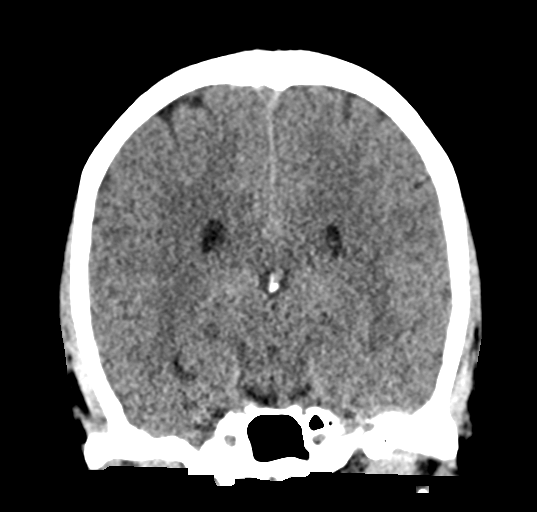
[im 33/60  brain]
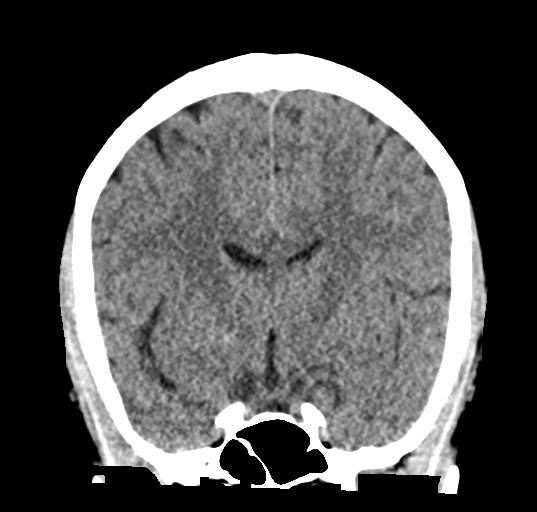

[Series 5: sag soft · sagittal · 0.31mm/px · 3 of 53 slices shown]
[im 18/53  brain]
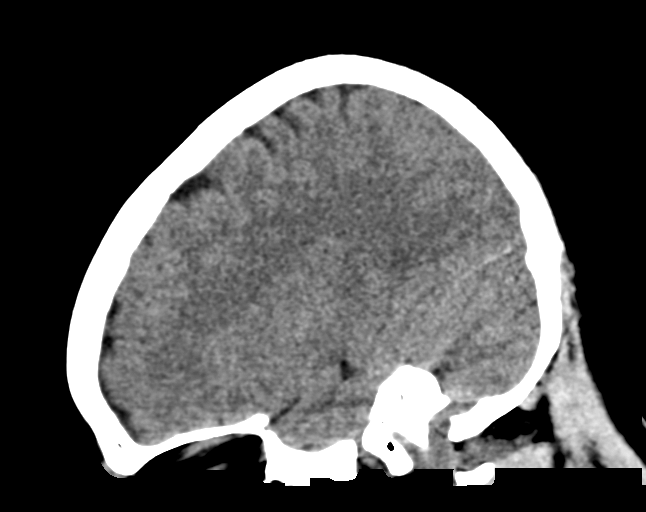
[im 27/53  brain]
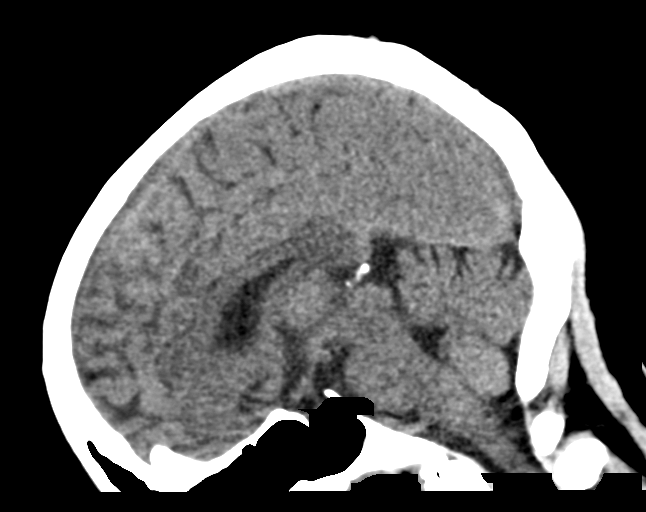
[im 35/53  brain]
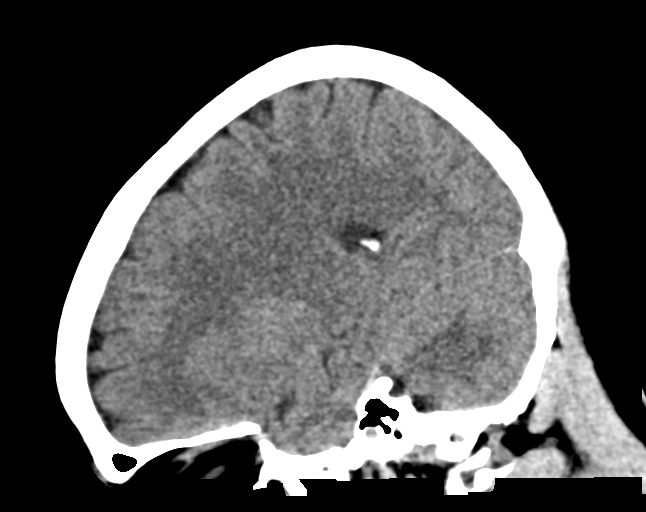

[16 of 47 positions shown; findings below may reference images not displayed]

FINDINGS: Brain: No evidence of acute infarction, hemorrhage, hydrocephalus,
extra-axial collection or mass lesion/mass effect.

Vascular: No hyperdense vessel or unexpected calcification.

Skull: Negative

Sinuses/Orbits: Mild mucosal in the sphenoid sinus otherwise clear.
Negative orbit

Other: None
IMPRESSION: Negative CT head

## 2018-07-21 DIAGNOSIS — I340 Nonrheumatic mitral (valve) insufficiency: Secondary | ICD-10-CM | POA: Diagnosis not present

## 2018-07-21 DIAGNOSIS — A048 Other specified bacterial intestinal infections: Secondary | ICD-10-CM | POA: Diagnosis not present

## 2018-07-21 DIAGNOSIS — R1084 Generalized abdominal pain: Secondary | ICD-10-CM | POA: Diagnosis not present

## 2018-07-21 DIAGNOSIS — R109 Unspecified abdominal pain: Secondary | ICD-10-CM | POA: Diagnosis not present

## 2018-08-07 ENCOUNTER — Encounter: Payer: Self-pay | Admitting: Nurse Practitioner

## 2018-08-07 ENCOUNTER — Ambulatory Visit: Payer: BLUE CROSS/BLUE SHIELD | Admitting: Nurse Practitioner

## 2018-08-07 VITALS — BP 164/80 | HR 81 | Temp 98.2°F | Ht 60.0 in | Wt 193.0 lb

## 2018-08-07 DIAGNOSIS — R011 Cardiac murmur, unspecified: Secondary | ICD-10-CM

## 2018-08-07 DIAGNOSIS — I10 Essential (primary) hypertension: Secondary | ICD-10-CM

## 2018-08-07 DIAGNOSIS — D509 Iron deficiency anemia, unspecified: Secondary | ICD-10-CM | POA: Diagnosis not present

## 2018-08-07 DIAGNOSIS — Z7251 High risk heterosexual behavior: Secondary | ICD-10-CM

## 2018-08-07 LAB — CBC WITH DIFFERENTIAL/PLATELET
BASOS PCT: 2.9 % (ref 0.0–3.0)
Basophils Absolute: 0.2 10*3/uL — ABNORMAL HIGH (ref 0.0–0.1)
EOS PCT: 1.9 % (ref 0.0–5.0)
Eosinophils Absolute: 0.1 10*3/uL (ref 0.0–0.7)
HCT: 25 % — ABNORMAL LOW (ref 36.0–46.0)
LYMPHS ABS: 1.4 10*3/uL (ref 0.7–4.0)
Lymphocytes Relative: 27.5 % (ref 12.0–46.0)
MCHC: 30.6 g/dL (ref 30.0–36.0)
MCV: 65.9 fl — AB (ref 78.0–100.0)
MONO ABS: 0.3 10*3/uL (ref 0.1–1.0)
MONOS PCT: 5.6 % (ref 3.0–12.0)
Neutro Abs: 3.2 10*3/uL (ref 1.4–7.7)
Neutrophils Relative %: 62.1 % (ref 43.0–77.0)
Platelets: 374 10*3/uL (ref 150.0–400.0)
RBC: 3.79 Mil/uL — ABNORMAL LOW (ref 3.87–5.11)
RDW: 20.4 % — AB (ref 11.5–15.5)
WBC: 5.1 10*3/uL (ref 4.0–10.5)

## 2018-08-07 LAB — IBC PANEL
IRON: 13 ug/dL — AB (ref 42–145)
Saturation Ratios: 2.6 % — ABNORMAL LOW (ref 20.0–50.0)
Transferrin: 353 mg/dL (ref 212.0–360.0)

## 2018-08-07 LAB — COMPREHENSIVE METABOLIC PANEL
ALK PHOS: 75 U/L (ref 39–117)
ALT: 8 U/L (ref 0–35)
AST: 12 U/L (ref 0–37)
Albumin: 4 g/dL (ref 3.5–5.2)
BILIRUBIN TOTAL: 0.3 mg/dL (ref 0.2–1.2)
BUN: 11 mg/dL (ref 6–23)
CO2: 23 meq/L (ref 19–32)
CREATININE: 0.88 mg/dL (ref 0.40–1.20)
Calcium: 9.8 mg/dL (ref 8.4–10.5)
Chloride: 105 mEq/L (ref 96–112)
GFR: 88.01 mL/min (ref >60.00)
GLUCOSE: 123 mg/dL — AB (ref 70–99)
Potassium: 4.1 mEq/L (ref 3.5–5.1)
Sodium: 137 mEq/L (ref 135–145)
TOTAL PROTEIN: 7.7 g/dL (ref 6.0–8.3)

## 2018-08-07 LAB — POCT URINE PREGNANCY: PREG TEST UR: NEGATIVE

## 2018-08-07 LAB — TSH: TSH: 1.65 u[IU]/mL (ref 0.35–4.50)

## 2018-08-07 MED ORDER — AMLODIPINE BESYLATE 5 MG PO TABS: 5.0000 mg | ORAL_TABLET | Freq: Every day | ORAL | 1 refills | Status: DC

## 2018-08-07 NOTE — Progress Notes (Addendum)
Subjective:  Patient ID: Yesenia Mills, female    DOB: 08-10-1971  Age: 47 y.o. MRN: [id number]  CC: Establish Care (est care/high BP consult/back and left arm pain--not sure if it is relate/ goingon 2 wks /cant remember tdap. )  Hypertension  This is a chronic problem. The current episode started more than 1 year ago. The problem has been gradually worsening since onset. The problem is uncontrolled. Associated symptoms include headaches. Pertinent negatives include no anxiety, chest pain, malaise/fatigue, neck pain, orthopnea, palpitations, peripheral edema, PND, shortness of breath or sweats. There are no associated agents to hypertension. Risk factors for coronary artery disease include obesity and sedentary lifestyle. Past treatments include ACE inhibitors and angiotensin blockers. Improvement on treatment: unknown. There is no history of a hypertension causing med, sleep apnea or a thyroid problem.   Home BP reading: 180s/100s.  Hx of anemia: no OTC supplement used  Reviewed past Medical, Social and Family history today.  Outpatient Medications Prior to Visit  Medication Sig Dispense Refill  . omeprazole (PRILOSEC) 40 MG capsule Take 40 mg by mouth daily.    . sucralfate (CARAFATE) 1 g tablet Take 1 g by mouth 4 (four) times daily -  with meals and at bedtime.    Marland Kitchen acyclovir (ZOVIRAX) 400 MG tablet Take 2 tablets (800 mg total) by mouth 4 (four) times daily. 56 tablet 0  . clindamycin (CLEOCIN) 150 MG capsule Take 2 capsules (300 mg total) by mouth 4 (four) times daily. 40 capsule 0  . Dexlansoprazole (DEXILANT PO) Take by mouth.    Marland Kitchen HYDROcodone-acetaminophen (NORCO/VICODIN) 5-325 MG tablet Take 1-2 tablets by mouth every 6 (six) hours as needed. 10 tablet 0  . lisinopril (PRINIVIL,ZESTRIL) 10 MG tablet Take 10 mg by mouth daily.    . predniSONE (DELTASONE) 10 MG tablet Take 2 tablets (20 mg total) by mouth 2 (two) times daily. 12 tablet 0  . triamcinolone cream (KENALOG) 0.1 %  Apply 1 application topically 2 (two) times daily. To affected area for no more than 4 days 30 g 0  . ferrous sulfate 325 (65 FE) MG tablet Take 1 tablet (325 mg total) by mouth 3 (three) times daily with meals. 60 tablet 0   No facility-administered medications prior to visit.    ROS See HPI  ECG: NSR, no ST segment or T wave abnormality.  Objective:  BP (!) 164/80   Pulse 81   Temp 98.2 F (36.8 C) (Oral)   Ht 5' (1.524 m)   Wt 193 lb (87.5 kg)   LMP 07/15/2018 (Exact Date)   SpO2 98%   BMI 37.69 kg/m   BP Readings from Last 3 Encounters:  08/07/18 (!) 164/80  05/14/17 (!) 157/79  03/12/17 (!) 152/89    Wt Readings from Last 3 Encounters:  08/07/18 193 lb (87.5 kg)  05/14/17 180 lb (81.6 kg)  03/12/17 181 lb (82.1 kg)    Physical Exam Vitals signs reviewed.  Constitutional:      General: She is not in acute distress.    Appearance: She is not ill-appearing or diaphoretic.  Neck:     Musculoskeletal: Normal range of motion and neck supple.  Cardiovascular:     Rate and Rhythm: Normal rate and regular rhythm.     Pulses: Normal pulses.     Heart sounds: Murmur present. Systolic murmur present.  Pulmonary:     Effort: Pulmonary effort is normal.     Breath sounds: Normal breath sounds.  Musculoskeletal:  Right lower leg: No edema.     Left lower leg: No edema.  Neurological:     Mental Status: She is alert.  Psychiatric:        Mood and Affect: Mood normal.        Behavior: Behavior normal.        Thought Content: Thought content normal.    Lab Results  Component Value Date   WBC 5.1 08/07/2018   HGB 7.6 Repeated and verified X2. (LL) 08/07/2018   HCT 25.0 (L) 08/07/2018   PLT 374.0 08/07/2018   GLUCOSE 123 (H) 08/07/2018   ALT 8 08/07/2018   AST 12 08/07/2018   NA 137 08/07/2018   K 4.1 08/07/2018   CL 105 08/07/2018   CREATININE 0.88 08/07/2018   BUN 11 08/07/2018   CO2 23 08/07/2018   TSH 1.65 08/07/2018    Assessment & Plan:    Yesenia Mills was seen today for establish care.  Diagnoses and all orders for this visit:  Essential hypertension -     EKG 12-Lead -     CBC w/Diff -     TSH -     Comprehensive metabolic panel -     amLODipine (NORVASC) 5 MG tablet; Take 1 tablet (5 mg total) by mouth at bedtime.  Heart murmur -     ECHOCARDIOGRAM COMPLETE; Future  Iron deficiency anemia, unspecified iron deficiency anemia type -     CBC w/Diff -     IBC panel -     Ambulatory referral to Hematology -     docusate sodium (COLACE) 100 MG capsule; Take 1 capsule (100 mg total) by mouth 2 (two) times daily. -     ferrous sulfate 325 (65 FE) MG tablet; Take 1 tablet (325 mg total) by mouth 3 (three) times daily with meals.  Unprotected sexual intercourse -     POCT urine pregnancy   I am having Yesenia Mills start on amLODipine and docusate sodium. I am also having her maintain her predniSONE, HYDROcodone-acetaminophen, acyclovir, clindamycin, triamcinolone cream, Dexlansoprazole (DEXILANT PO), lisinopril, omeprazole, sucralfate, and ferrous sulfate.  Meds ordered this encounter  Medications  . amLODipine (NORVASC) 5 MG tablet    Sig: Take 1 tablet (5 mg total) by mouth at bedtime.    Dispense:  30 tablet    Refill:  1    Order Specific Question:   Supervising Provider    Answer:   Lucille Passy [[id number]]  . docusate sodium (COLACE) 100 MG capsule    Sig: Take 1 capsule (100 mg total) by mouth 2 (two) times daily.    Dispense:  30 capsule    Refill:  0    Order Specific Question:   Supervising Provider    Answer:   Lucille Passy [[id number]]  . ferrous sulfate 325 (65 FE) MG tablet    Sig: Take 1 tablet (325 mg total) by mouth 3 (three) times daily with meals.    Dispense:  90 tablet    Refill:  5    Order Specific Question:   Supervising Provider    Answer:   Lucille Passy [[id number]]    Problem List Items Addressed This Visit      Cardiovascular and Mediastinum   Essential hypertension - Primary   Relevant  Medications   amLODipine (NORVASC) 5 MG tablet   Other Relevant Orders   EKG 12-Lead (Completed)   CBC w/Diff (Completed)   TSH (Completed)   Comprehensive metabolic panel (Completed)  Other   Anemia   Relevant Medications   docusate sodium (COLACE) 100 MG capsule   ferrous sulfate 325 (65 FE) MG tablet   Other Relevant Orders   CBC w/Diff (Completed)   IBC panel (Completed)   Ambulatory referral to Hematology   Heart murmur   Relevant Orders   ECHOCARDIOGRAM COMPLETE    Other Visit Diagnoses    Unprotected sexual intercourse       Relevant Orders   POCT urine pregnancy (Completed)       Follow-up: Return in about 2 weeks (around 08/21/2018) for HTN(21mins).  Wilfred Lacy, NP

## 2018-08-07 NOTE — Patient Instructions (Addendum)
Go to lab for blood draw and urine collection  Start amlodipine today.  Start DASH diet.  Return to office sooner if BP >180/90 for more than 2days   DASH Eating Plan DASH stands for "Dietary Approaches to Stop Hypertension." The DASH eating plan is a healthy eating plan that has been shown to reduce high blood pressure (hypertension). It may also reduce your risk for type 2 diabetes, heart disease, and stroke. The DASH eating plan may also help with weight loss. What are tips for following this plan?  General guidelines  Avoid eating more than 2,300 mg (milligrams) of salt (sodium) a day. If you have hypertension, you may need to reduce your sodium intake to 1,500 mg a day.  Limit alcohol intake to no more than 1 drink a day for nonpregnant women and 2 drinks a day for men. One drink equals 12 oz of beer, 5 oz of wine, or 1 oz of hard liquor.  Work with your health care provider to maintain a healthy body weight or to lose weight. Ask what an ideal weight is for you.  Get at least 30 minutes of exercise that causes your heart to beat faster (aerobic exercise) most days of the week. Activities may include walking, swimming, or biking.  Work with your health care provider or diet and nutrition specialist (dietitian) to adjust your eating plan to your individual calorie needs. Reading food labels   Check food labels for the amount of sodium per serving. Choose foods with less than 5 percent of the Daily Value of sodium. Generally, foods with less than 300 mg of sodium per serving fit into this eating plan.  To find whole grains, look for the word "whole" as the first word in the ingredient list. Shopping  Buy products labeled as "low-sodium" or "no salt added."  Buy fresh foods. Avoid canned foods and premade or frozen meals. Cooking  Avoid adding salt when cooking. Use salt-free seasonings or herbs instead of table salt or sea salt. Check with your health care provider or  pharmacist before using salt substitutes.  Do not fry foods. Cook foods using healthy methods such as baking, boiling, grilling, and broiling instead.  Cook with heart-healthy oils, such as olive, canola, soybean, or sunflower oil. Meal planning  Eat a balanced diet that includes: ? 5 or more servings of fruits and vegetables each day. At each meal, try to fill half of your plate with fruits and vegetables. ? Up to 6-8 servings of whole grains each day. ? Less than 6 oz of lean meat, poultry, or fish each day. A 3-oz serving of meat is about the same size as a deck of cards. One egg equals 1 oz. ? 2 servings of low-fat dairy each day. ? A serving of nuts, seeds, or beans 5 times each week. ? Heart-healthy fats. Healthy fats called Omega-3 fatty acids are found in foods such as flaxseeds and coldwater fish, like sardines, salmon, and mackerel.  Limit how much you eat of the following: ? Canned or prepackaged foods. ? Food that is high in trans fat, such as fried foods. ? Food that is high in saturated fat, such as fatty meat. ? Sweets, desserts, sugary drinks, and other foods with added sugar. ? Full-fat dairy products.  Do not salt foods before eating.  Try to eat at least 2 vegetarian meals each week.  Eat more home-cooked food and less restaurant, buffet, and fast food.  When eating at a restaurant, ask  that your food be prepared with less salt or no salt, if possible. What foods are recommended? The items listed may not be a complete list. Talk with your dietitian about what dietary choices are best for you. Grains Whole-grain or whole-wheat bread. Whole-grain or whole-wheat pasta. Brown rice. [place]. Bulgur. Whole-grain and low-sodium cereals. Pita bread. Low-fat, low-sodium crackers. Whole-wheat flour tortillas. Vegetables Fresh or frozen vegetables (raw, steamed, roasted, or grilled). Low-sodium or reduced-sodium tomato and vegetable juice. Low-sodium or  reduced-sodium tomato sauce and tomato paste. Low-sodium or reduced-sodium canned vegetables. Fruits All fresh, dried, or frozen fruit. Canned fruit in natural juice (without added sugar). Meat and other protein foods Skinless chicken or [place]. Ground chicken or [place]. Pork with fat trimmed off. Fish and seafood. Egg whites. Dried beans, peas, or lentils. Unsalted nuts, nut butters, and seeds. Unsalted canned beans. Lean cuts of beef with fat trimmed off. Low-sodium, lean deli meat. Dairy Low-fat (1%) or fat-free (skim) milk. Fat-free, low-fat, or reduced-fat cheeses. Nonfat, low-sodium ricotta or cottage cheese. Low-fat or nonfat yogurt. Low-fat, low-sodium cheese. Fats and oils Soft margarine without trans fats. Vegetable oil. Low-fat, reduced-fat, or light mayonnaise and salad dressings (reduced-sodium). Canola, safflower, olive, soybean, and sunflower oils. Avocado. Seasoning and other foods Herbs. Spices. Seasoning mixes without salt. Unsalted popcorn and pretzels. Fat-free sweets. What foods are not recommended? The items listed may not be a complete list. Talk with your dietitian about what dietary choices are best for you. Grains Baked goods made with fat, such as croissants, muffins, or some breads. Dry pasta or rice meal packs. Vegetables Creamed or fried vegetables. Vegetables in a cheese sauce. Regular canned vegetables (not low-sodium or reduced-sodium). Regular canned tomato sauce and paste (not low-sodium or reduced-sodium). Regular tomato and vegetable juice (not low-sodium or reduced-sodium). Angie Fava. Olives. Fruits Canned fruit in a light or heavy syrup. Fried fruit. Fruit in cream or butter sauce. Meat and other protein foods Fatty cuts of meat. Ribs. Fried meat. Berniece Salines. Sausage. Bologna and other processed lunch meats. Salami. Fatback. Hotdogs. Bratwurst. Salted nuts and seeds. Canned beans with added salt. Canned or smoked fish. Whole eggs or egg yolks. Chicken or [place]  with skin. Dairy Whole or 2% milk, cream, and half-and-half. Whole or full-fat cream cheese. Whole-fat or sweetened yogurt. Full-fat cheese. Nondairy creamers. Whipped toppings. Processed cheese and cheese spreads. Fats and oils Butter. Stick margarine. Lard. Shortening. Ghee. Bacon fat. Tropical oils, such as coconut, palm kernel, or palm oil. Seasoning and other foods Salted popcorn and pretzels. Onion salt, garlic salt, seasoned salt, table salt, and sea salt. Worcestershire sauce. Tartar sauce. Barbecue sauce. Teriyaki sauce. Soy sauce, including reduced-sodium. Steak sauce. Canned and packaged gravies. Fish sauce. Oyster sauce. Cocktail sauce. Horseradish that you find on the shelf. Ketchup. Mustard. Meat flavorings and tenderizers. Bouillon cubes. Hot sauce and Tabasco sauce. Premade or packaged marinades. Premade or packaged taco seasonings. Relishes. Regular salad dressings. Where to find more information:  National Heart, Lung, and [place]: https://wilson-eaton.com/  American Heart Association: www.heart.org Summary  The DASH eating plan is a healthy eating plan that has been shown to reduce high blood pressure (hypertension). It may also reduce your risk for type 2 diabetes, heart disease, and stroke.  With the DASH eating plan, you should limit salt (sodium) intake to 2,300 mg a day. If you have hypertension, you may need to reduce your sodium intake to 1,500 mg a day.  When on the DASH eating plan, aim to eat more fresh fruits  and vegetables, whole grains, lean proteins, low-fat dairy, and heart-healthy fats.  Work with your health care provider or diet and nutrition specialist (dietitian) to adjust your eating plan to your individual calorie needs. This information is not intended to replace advice given to you by your health care provider. Make sure you discuss any questions you have with your health care provider. Document Released: 07/26/2011 Document Revised: 07/30/2016  Document Reviewed: 07/30/2016 Elsevier Interactive Patient Education  2019 [employer].   Hypertension Hypertension, commonly called high blood pressure, is when the force of blood pumping through the arteries is too strong. The arteries are the blood vessels that carry blood from the heart throughout the body. Hypertension forces the heart to work harder to pump blood and may cause arteries to become narrow or stiff. Having untreated or uncontrolled hypertension can cause heart attacks, strokes, kidney disease, and other problems. A blood pressure reading consists of a higher number over a lower number. Ideally, your blood pressure should be below 120/80. The first ("top") number is called the systolic pressure. It is a measure of the pressure in your arteries as your heart beats. The second ("bottom") number is called the diastolic pressure. It is a measure of the pressure in your arteries as the heart relaxes. What are the causes? The cause of this condition is not known. What increases the risk? Some risk factors for high blood pressure are under your control. Others are not. Factors you can change  Smoking.  Having type 2 diabetes mellitus, high cholesterol, or both.  Not getting enough exercise or physical activity.  Being overweight.  Having too much fat, sugar, calories, or salt (sodium) in your diet.  Drinking too much alcohol. Factors that are difficult or impossible to change  Having chronic kidney disease.  Having a family history of high blood pressure.  Age. Risk increases with age.  Race. You may be at higher risk if you are African-American.  Gender. Men are at higher risk than women before age 53. After age 50, women are at higher risk than men.  Having obstructive sleep apnea.  Stress. What are the signs or symptoms? Extremely high blood pressure (hypertensive crisis) may cause:  Headache.  Anxiety.  Shortness of breath.  Nosebleed.  Nausea and  vomiting.  Severe chest pain.  Jerky movements you cannot control (seizures). How is this diagnosed? This condition is diagnosed by measuring your blood pressure while you are seated, with your arm resting on a surface. The cuff of the blood pressure monitor will be placed directly against the skin of your upper arm at the level of your heart. It should be measured at least twice using the same arm. Certain conditions can cause a difference in blood pressure between your right and left arms. Certain factors can cause blood pressure readings to be lower or higher than normal (elevated) for a short period of time:  When your blood pressure is higher when you are in a health care provider's office than when you are at home, this is called white coat hypertension. Most people with this condition do not need medicines.  When your blood pressure is higher at home than when you are in a health care provider's office, this is called masked hypertension. Most people with this condition may need medicines to control blood pressure. If you have a high blood pressure reading during one visit or you have normal blood pressure with other risk factors:  You may be asked to return on  a different day to have your blood pressure checked again.  You may be asked to monitor your blood pressure at home for 1 week or longer. If you are diagnosed with hypertension, you may have other blood or imaging tests to help your health care provider understand your overall risk for other conditions. How is this treated? This condition is treated by making healthy lifestyle changes, such as eating healthy foods, exercising more, and reducing your alcohol intake. Your health care provider may prescribe medicine if lifestyle changes are not enough to get your blood pressure under control, and if:  Your systolic blood pressure is above 130.  Your diastolic blood pressure is above 80. Your personal target blood pressure may vary  depending on your medical conditions, your age, and other factors. Follow these instructions at home: Eating and drinking   Eat a diet that is high in fiber and potassium, and low in sodium, added sugar, and fat. An example eating plan is called the DASH (Dietary Approaches to Stop Hypertension) diet. To eat this way: ? Eat plenty of fresh fruits and vegetables. Try to fill half of your plate at each meal with fruits and vegetables. ? Eat whole grains, such as whole wheat pasta, brown rice, or whole grain bread. Fill about one quarter of your plate with whole grains. ? Eat or drink low-fat dairy products, such as skim milk or low-fat yogurt. ? Avoid fatty cuts of meat, processed or cured meats, and poultry with skin. Fill about one quarter of your plate with lean proteins, such as fish, chicken without skin, beans, eggs, and tofu. ? Avoid premade and processed foods. These tend to be higher in sodium, added sugar, and fat.  Reduce your daily sodium intake. Most people with hypertension should eat less than 1,500 mg of sodium a day.  Limit alcohol intake to no more than 1 drink a day for nonpregnant women and 2 drinks a day for men. One drink equals 12 oz of beer, 5 oz of wine, or 1 oz of hard liquor. Lifestyle   Work with your health care provider to maintain a healthy body weight or to lose weight. Ask what an ideal weight is for you.  Get at least 30 minutes of exercise that causes your heart to beat faster (aerobic exercise) most days of the week. Activities may include walking, swimming, or biking.  Include exercise to strengthen your muscles (resistance exercise), such as pilates or lifting weights, as part of your weekly exercise routine. Try to do these types of exercises for 30 minutes at least 3 days a week.  Do not use any products that contain nicotine or tobacco, such as cigarettes and e-cigarettes. If you need help quitting, ask your health care provider.  Monitor your blood  pressure at home as told by your health care provider.  Keep all follow-up visits as told by your health care provider. This is important. Medicines  Take over-the-counter and prescription medicines only as told by your health care provider. Follow directions carefully. Blood pressure medicines must be taken as prescribed.  Do not skip doses of blood pressure medicine. Doing this puts you at risk for problems and can make the medicine less effective.  Ask your health care provider about side effects or reactions to medicines that you should watch for. Contact a health care provider if:  You think you are having a reaction to a medicine you are taking.  You have headaches that keep coming back (recurring).  You feel dizzy.  You have swelling in your ankles.  You have trouble with your vision. Get help right away if:  You develop a severe headache or confusion.  You have unusual weakness or numbness.  You feel faint.  You have severe pain in your chest or abdomen.  You vomit repeatedly.  You have trouble breathing. Summary  Hypertension is when the force of blood pumping through your arteries is too strong. If this condition is not controlled, it may put you at risk for serious complications.  Your personal target blood pressure may vary depending on your medical conditions, your age, and other factors. For most people, a normal blood pressure is less than 120/80.  Hypertension is treated with lifestyle changes, medicines, or a combination of both. Lifestyle changes include weight loss, eating a healthy, low-sodium diet, exercising more, and limiting alcohol. This information is not intended to replace advice given to you by your health care provider. Make sure you discuss any questions you have with your health care provider. Document Released: 08/06/2005 Document Revised: 07/04/2016 Document Reviewed: 07/04/2016 Elsevier Interactive Patient Education  2019 [employer].

## 2018-08-08 ENCOUNTER — Other Ambulatory Visit (HOSPITAL_COMMUNITY): Payer: BLUE CROSS/BLUE SHIELD

## 2018-08-08 DIAGNOSIS — R0989 Other specified symptoms and signs involving the circulatory and respiratory systems: Secondary | ICD-10-CM

## 2018-08-08 MED ORDER — FERROUS SULFATE 325 (65 FE) MG PO TABS: 325.0000 mg | ORAL_TABLET | Freq: Three times a day (TID) | ORAL | 5 refills | Status: DC

## 2018-08-08 MED ORDER — DOCUSATE SODIUM 100 MG PO CAPS: 100.0000 mg | ORAL_CAPSULE | Freq: Two times a day (BID) | ORAL | 0 refills | Status: DC

## 2018-08-08 NOTE — Addendum Note (Signed)
Addended by: Wilfred Lacy L on: 08/08/2018 10:37 AM   Modules accepted: Orders

## 2018-08-11 ENCOUNTER — Telehealth: Payer: Self-pay | Admitting: Hematology

## 2018-08-11 NOTE — Telephone Encounter (Signed)
Spoke with patient to confirm new patient appointment  08/22/18 at 0830 am

## 2018-08-15 ENCOUNTER — Other Ambulatory Visit: Payer: Self-pay

## 2018-08-15 ENCOUNTER — Encounter (HOSPITAL_BASED_OUTPATIENT_CLINIC_OR_DEPARTMENT_OTHER): Payer: Self-pay | Admitting: *Deleted

## 2018-08-15 ENCOUNTER — Encounter: Payer: Self-pay | Admitting: Nurse Practitioner

## 2018-08-15 ENCOUNTER — Emergency Department (HOSPITAL_BASED_OUTPATIENT_CLINIC_OR_DEPARTMENT_OTHER)
Admission: EM | Admit: 2018-08-15 | Discharge: 2018-08-15 | Disposition: A | Discharge status: Home / Self Care | Payer: BLUE CROSS/BLUE SHIELD | Attending: Emergency Medicine | Admitting: Emergency Medicine

## 2018-08-15 ENCOUNTER — Emergency Department (HOSPITAL_BASED_OUTPATIENT_CLINIC_OR_DEPARTMENT_OTHER): Payer: BLUE CROSS/BLUE SHIELD

## 2018-08-15 ENCOUNTER — Ambulatory Visit (HOSPITAL_BASED_OUTPATIENT_CLINIC_OR_DEPARTMENT_OTHER): Payer: BLUE CROSS/BLUE SHIELD

## 2018-08-15 DIAGNOSIS — R51 Headache: Secondary | ICD-10-CM | POA: Insufficient documentation

## 2018-08-15 DIAGNOSIS — I10 Essential (primary) hypertension: Secondary | ICD-10-CM | POA: Diagnosis not present

## 2018-08-15 DIAGNOSIS — R011 Cardiac murmur, unspecified: Secondary | ICD-10-CM | POA: Insufficient documentation

## 2018-08-15 DIAGNOSIS — Z79899 Other long term (current) drug therapy: Secondary | ICD-10-CM | POA: Insufficient documentation

## 2018-08-15 DIAGNOSIS — R519 Headache, unspecified: Secondary | ICD-10-CM

## 2018-08-15 DIAGNOSIS — R9431 Abnormal electrocardiogram [ECG] [EKG]: Secondary | ICD-10-CM | POA: Diagnosis not present

## 2018-08-15 DIAGNOSIS — R079 Chest pain, unspecified: Secondary | ICD-10-CM | POA: Diagnosis not present

## 2018-08-15 LAB — CBC
HCT: 27.5 % — ABNORMAL LOW (ref 36.0–46.0)
Hemoglobin: 7.4 g/dL — ABNORMAL LOW (ref 12.0–15.0)
MCH: 19.2 pg — ABNORMAL LOW (ref 26.0–34.0)
MCHC: 26.9 g/dL — ABNORMAL LOW (ref 30.0–36.0)
MCV: 71.2 fL — ABNORMAL LOW (ref 80.0–100.0)
Platelets: 502 10*3/uL — ABNORMAL HIGH (ref 150–400)
RBC: 3.86 MIL/uL — ABNORMAL LOW (ref 3.87–5.11)
RDW: 20.1 % — ABNORMAL HIGH (ref 11.5–15.5)
WBC: 8.2 10*3/uL (ref 4.0–10.5)
nRBC: 0 % (ref 0.0–0.2)

## 2018-08-15 LAB — BASIC METABOLIC PANEL
Anion gap: 8 (ref 5–15)
BUN: 14 mg/dL (ref 6–20)
CO2: 23 mmol/L (ref 22–32)
Calcium: 9.7 mg/dL (ref 8.9–10.3)
Chloride: 105 mmol/L (ref 98–111)
Creatinine, Ser: 0.99 mg/dL (ref 0.44–1.00)
GFR calc Af Amer: >60 mL/min (ref >60)
GFR calc non Af Amer: >60 mL/min (ref >60)
Glucose, Bld: 106 mg/dL — ABNORMAL HIGH (ref 70–99)
Potassium: 3.7 mmol/L (ref 3.5–5.1)
Sodium: 136 mmol/L (ref 135–145)

## 2018-08-15 LAB — PREGNANCY, URINE: Preg Test, Ur: NEGATIVE

## 2018-08-15 LAB — TROPONIN I: Troponin I: <0.03 ng/mL (ref <0.03)

## 2018-08-15 MED ORDER — KETOROLAC TROMETHAMINE 15 MG/ML IJ SOLN
15.0000 mg | Freq: Once | INTRAMUSCULAR | Status: AC
  Administered 2018-08-15: 15 mg via INTRAVENOUS
  Filled 2018-08-15: qty 1

## 2018-08-15 MED ORDER — DIPHENHYDRAMINE HCL 50 MG/ML IJ SOLN
25.0000 mg | Freq: Once | INTRAMUSCULAR | Status: AC
  Administered 2018-08-15: 25 mg via INTRAVENOUS
  Filled 2018-08-15: qty 1

## 2018-08-15 MED ORDER — SODIUM CHLORIDE 0.9 % IV BOLUS
1000.0000 mL | Freq: Once | INTRAVENOUS | Status: AC
  Administered 2018-08-15: 1000 mL via INTRAVENOUS

## 2018-08-15 MED ORDER — METOCLOPRAMIDE HCL 5 MG/ML IJ SOLN
10.0000 mg | Freq: Once | INTRAMUSCULAR | Status: AC
  Administered 2018-08-15: 10 mg via INTRAVENOUS
  Filled 2018-08-15: qty 2

## 2018-08-15 NOTE — ED Notes (Signed)
Patient transported to X-ray 

## 2018-08-15 NOTE — ED Notes (Signed)
Attempted blood draw when placing PIV.  Pt tolerated this well, IV placed on 1sta attempt but had difficulty drawing blood.  IV flushed.  Light green top sent to lab

## 2018-08-15 NOTE — ED Notes (Signed)
Pt also mentioned that she had a Hgb of 7 which she was notified of this past week, she was placed on iron pills but has not begun to take them.  Pt denies any bleeding but states that she had a stomach ulcers 4-5 years ago but denies current issues.

## 2018-08-15 NOTE — ED Notes (Signed)
Pt understood dc material. NAD noted. 

## 2018-08-15 NOTE — Discharge Instructions (Addendum)
I'm not sure if your headache is related to norvasc or not. It could be a potential side effect. Usually the headaches with it are minor. You are on a low dose. I think it's reasonable to either continue it and see if they improve or stop it for the time being and discuss further with your PCP.

## 2018-08-15 NOTE — ED Triage Notes (Signed)
Pt has been on BP medications for 2 weeks (5mg  Norvasc).  Pt has been taking medication as prescribed and was told to follow up if she had a HA.  Pt has been having a HA for 3 days and has noted that her BP has been elevated when she checked (170's).  Pt also has had some left shoulder pain intermittently.  Pt has a ECHO this am and BP was elevated there too so pt decided to come here for further evaluation.

## 2018-08-18 ENCOUNTER — Other Ambulatory Visit: Payer: Self-pay | Admitting: Hematology

## 2018-08-18 DIAGNOSIS — D509 Iron deficiency anemia, unspecified: Secondary | ICD-10-CM

## 2018-08-18 NOTE — Progress Notes (Signed)
[facility] NOTE  Patient Care Team: Nche, Charlene Brooke, NP as PCP - General (Internal Medicine)  HEME/ONC OVERVIEW: 1. Iron deficiency anemia  ASSESSMENT & PLAN:   Iron deficiency anemia -I reviewed the patient's records in detail, including PCP clinic notes and lab studies -In summary, patient has had microcytic anemia dating back to 2012, with Hgb~10; however, since 2014, her Hgb has slowly declined and stabilized ~mid-7 since 2018; iron profile suggestive of iron deficiency in 07/2018 but no ferritin was checked -She has not had any RBC or IV iron transfusion -Hgb 7.2 today; iron profile pending  -I reviewed the peripheral blood smear, which showed numerous hypochromic, microcytic RBCs with occasional target cell, consistent of severe iron deficiency -We discussed some of the risks, benefits, and alternatives of intravenous iron infusions.  -The patient is symptomatic from anemia and the iron level is critically low; as such, oral supplement is not sufficient to replete iron storage quickly and she will need IV iron to higher levels of iron faster for adequate hematopoesis.  -Some of the side-effects to be expected including risks of infusion reactions, phlebitis, headaches, nausea and fatigue.   -The patient is willing to proceed, 1st dose tentatively scheduled on 08/29/2018 -Goal is to keep ferritin level greater than 50 -As the patient has been chronically anemic and should respond to IV iron relatively quickly, we will hold off RBC transfusion  -Given the unclear etiology of iron deficiency, I will also check hemoglobin electrophoresis and [place] test to rule out other causes   Thrombocytosis -Most likely reactive in the setting of severe iron deficiency anemia -We will monitor it for now  History of gastric ulcer -Patient reports EGD a few years ago that showed a gastric ulcer, but she was told that it improved with PPI -She has not had any follow-up  endoscopy since then -Given the unexplained source of iron deficiency, I will refer the patient to gastroenterology for further evaluation  Orders Placed This Encounter  Procedures  . CBC with Differential (Cancer Center Only)    Standing Status:   Future    Standing Expiration Date:   09/26/2019  . Ferritin    Standing Status:   Future    Standing Expiration Date:   09/26/2019  . Iron and TIBC    Standing Status:   Future    Standing Expiration Date:   09/26/2019  . Hemoglobinopathy evaluation    Standing Status:   Future    Standing Expiration Date:   08/22/2019  . Ambulatory referral to Gastroenterology    Referral Priority:   Urgent    Referral Type:   Consultation    Referral Reason:   Specialty Services Required    Number of Visits Requested:   1  . Direct antiglobulin test (not at [facility])    Standing Status:   Future    Standing Expiration Date:   09/26/2019    All questions were answered. The patient knows to call the clinic with any problems, questions or concerns.  Return on 09/19/2018 for labs and clinic follow-up.   Tish Men, MD 08/22/18 9:41 AM   CHIEF COMPLAINTS/PURPOSE OF CONSULTATION:  "I am here for my low red blood cell count"  HISTORY OF PRESENTING ILLNESS:  Yesenia Mills 47 y.o. female is here because of iron deficiency anemia.   She has had microcytic anemia dating back to 2012.  Hemoglobin was around 10 at that time, but has slowly declined since then and most recently  stabilized in the mid-7's since 2018. She reports chronic, stable exertional dyspnea after walking up a few flights of stairs, fatigue and occasional headache, but denies recent chest pain, pre-syncopal episodes, or palpitations. She had not noticed any recent bleeding such as epistaxis, hematuria or hematochezia. The patient denies over the counter NSAID ingestion.  Her last colonoscopy was a few years ago, which she reported was normal (no records available). She had no prior history or  diagnosis of cancer.  She denies any pica.  She stopped eating pork and beef over the past 16-month after she was diagnosed with hypertension, and is trying to lose weight. She never donated blood or received blood transfusion She tried oral iron supplement a year ago, but had GI upsets and constipation, and stopped the medication.  During that time, she also noticed black stool, but the stool color returned to normal after she stopped taking oral iron supplement.  MEDICAL HISTORY:  Past Medical History:  Diagnosis Date  . Gastritis   . GERD (gastroesophageal reflux disease)   . Hypertension     SURGICAL HISTORY: History reviewed. No pertinent surgical history.  SOCIAL HISTORY: Social History   Socioeconomic History  . Marital status: Married    Spouse name: Not on file  . Number of children: Not on file  . Years of education: Not on file  . Highest education level: Not on file  Occupational History  . Not on file  Social Needs  . Financial resource strain: Not on file  . Food insecurity:    Worry: Not on file    Inability: Not on file  . Transportation needs:    Medical: Not on file    Non-medical: Not on file  Tobacco Use  . Smoking status: Never Smoker  . Smokeless tobacco: Never Used  Substance and Sexual Activity  . Alcohol use: Yes    Alcohol/week: 1.0 standard drinks    Types: 1 Glasses of wine per week    Comment: once in awhile  . Drug use: No  . Sexual activity: Yes    Birth control/protection: None  Lifestyle  . Physical activity:    Days per week: Not on file    Minutes per session: Not on file  . Stress: Not on file  Relationships  . Social connections:    Talks on phone: Not on file    Gets together: Not on file    Attends religious service: Not on file    Active member of club or organization: Not on file    Attends meetings of clubs or organizations: Not on file    Relationship status: Not on file  . Intimate partner violence:    Fear of  current or ex partner: Not on file    Emotionally abused: Not on file    Physically abused: Not on file    Forced sexual activity: Not on file  Other Topics Concern  . Not on file  Social History Narrative  . Not on file    FAMILY HISTORY: Family History  Problem Relation Age of Onset  . Hypertension Mother   . Kidney disease Father   . Heart disease Maternal Grandmother   . Heart failure Maternal Grandmother   . Stroke Maternal Grandmother   . Heart disease Paternal Grandmother   . Heart failure Paternal Grandmother   . Stroke Paternal Grandmother     ALLERGIES:   has No Known Allergies.  MEDICATIONS:  Current Outpatient Medications  Medication Sig Dispense Refill  .  amLODipine (NORVASC) 5 MG tablet Take 1 tablet (5 mg total) by mouth at bedtime. 30 tablet 1  . docusate sodium (COLACE) 100 MG capsule Take 1 capsule (100 mg total) by mouth 2 (two) times daily. 30 capsule 0  . ferrous sulfate 325 (65 FE) MG tablet Take 1 tablet (325 mg total) by mouth 3 (three) times daily with meals. 90 tablet 5  . omeprazole (PRILOSEC) 40 MG capsule Take 40 mg by mouth daily.    . predniSONE (DELTASONE) 10 MG tablet Take 2 tablets (20 mg total) by mouth 2 (two) times daily. 12 tablet 0  . Dexlansoprazole (DEXILANT PO) Take by mouth.     No current facility-administered medications for this visit.     REVIEW OF SYSTEMS:   Constitutional: ( - ) fevers, ( - )  chills , ( - ) night sweats Eyes: ( - ) blurriness of vision, ( - ) double vision, ( - ) watery eyes Ears, nose, mouth, throat, and face: ( - ) mucositis, ( - ) sore throat Respiratory: ( - ) cough, ( - ) dyspnea, ( - ) wheezes Cardiovascular: ( - ) palpitation, ( - ) chest discomfort, ( - ) lower extremity swelling Gastrointestinal:  ( - ) nausea, ( - ) heartburn, ( - ) change in bowel habits Skin: ( - ) abnormal skin rashes Lymphatics: ( - ) new lymphadenopathy, ( - ) easy bruising Neurological: ( - ) numbness, ( - ) tingling, (  - ) new weaknesses Behavioral/Psych: ( - ) mood change, ( - ) new changes  All other systems were reviewed with the patient and are negative.  PHYSICAL EXAMINATION: ECOG PERFORMANCE STATUS: 1 - Symptomatic but completely ambulatory  Vitals:   08/22/18 0922  BP: (!) 143/69  Pulse: 70  Resp: 18  Temp: 98.2 F (36.8 C)  SpO2: 100%   Filed Weights   08/22/18 0922  Weight: 194 lb 12.8 oz (88.4 kg)    GENERAL: alert, no distress and comfortable SKIN: skin color, texture, turgor are normal, no rashes or significant lesions EYES: conjunctiva are pink and non-injected, sclera clear OROPHARYNX: no exudate, no erythema; lips, buccal mucosa, and tongue normal  NECK: supple, non-tender LYMPH:  no palpable lymphadenopathy in the cervical or axillary  LUNGS: clear to auscultation and percussion with normal breathing effort HEART: regular rate & rhythm and no murmurs and no lower extremity edema ABDOMEN: soft, non-tender, non-distended, normal bowel sounds Musculoskeletal: no cyanosis of digits and no clubbing  PSYCH: alert & oriented x 3, fluent speech NEURO: no focal motor/sensory deficits  LABORATORY DATA:  I have reviewed the data as listed Lab Results  Component Value Date   WBC 7.4 08/22/2018   HGB 7.3 (L) 08/22/2018   HCT 26.5 (L) 08/22/2018   MCV 70.5 (L) 08/22/2018   PLT 557 (H) 08/22/2018   Recent Labs    08/07/18 1150 08/15/18 1438 08/22/18 0835  NA 137 136 138  K 4.1 3.7 3.9  CL 105 105 105  CO2 23 23 27   GLUCOSE 123* 106* 95  BUN 11 14 11   CREATININE 0.88 0.99 0.91  CALCIUM 9.8 9.7 9.2  GFRNONAA  --  >60 >60  GFRAA  --  >60 >60  PROT 7.7  --  8.1  ALBUMIN 4.0  --  4.1  AST 12  --  12*  ALT 8  --  10  ALKPHOS 75  --  84  BILITOT 0.3  --  0.4   I  personally reviewed the patient's peripheral blood smear today.  The red blood cells were hypochromic and microcytic, with occasional target cells.  There was no schistocytosis.  The white blood cells were of  normal morphology. There were no peripheral circulating blasts.  There were increased number of platelets. I verified that there were no platelet clumping.

## 2018-08-22 ENCOUNTER — Telehealth: Payer: Self-pay | Admitting: Hematology & Oncology

## 2018-08-22 ENCOUNTER — Inpatient Hospital Stay: Payer: BLUE CROSS/BLUE SHIELD | Attending: Hematology | Admitting: Hematology

## 2018-08-22 ENCOUNTER — Inpatient Hospital Stay: Payer: BLUE CROSS/BLUE SHIELD

## 2018-08-22 ENCOUNTER — Encounter: Payer: Self-pay | Admitting: Hematology

## 2018-08-22 VITALS — BP 143/69 | HR 70 | Temp 98.2°F | Resp 18 | Ht 61.0 in | Wt 194.8 lb

## 2018-08-22 DIAGNOSIS — Z8711 Personal history of peptic ulcer disease: Secondary | ICD-10-CM | POA: Insufficient documentation

## 2018-08-22 DIAGNOSIS — Z8719 Personal history of other diseases of the digestive system: Secondary | ICD-10-CM | POA: Insufficient documentation

## 2018-08-22 DIAGNOSIS — D509 Iron deficiency anemia, unspecified: Secondary | ICD-10-CM | POA: Diagnosis not present

## 2018-08-22 DIAGNOSIS — D473 Essential (hemorrhagic) thrombocythemia: Secondary | ICD-10-CM | POA: Insufficient documentation

## 2018-08-22 DIAGNOSIS — D75839 Thrombocytosis, unspecified: Secondary | ICD-10-CM | POA: Insufficient documentation

## 2018-08-22 LAB — CMP (CANCER CENTER ONLY)
ALT: 10 U/L (ref 0–44)
AST: 12 U/L — ABNORMAL LOW (ref 15–41)
Albumin: 4.1 g/dL (ref 3.5–5.0)
Alkaline Phosphatase: 84 U/L (ref 38–126)
Anion gap: 6 (ref 5–15)
BUN: 11 mg/dL (ref 6–20)
CO2: 27 mmol/L (ref 22–32)
Calcium: 9.2 mg/dL (ref 8.9–10.3)
Chloride: 105 mmol/L (ref 98–111)
Creatinine: 0.91 mg/dL (ref 0.44–1.00)
GFR, Est AFR Am: >60 mL/min (ref >60)
GFR, Estimated: >60 mL/min (ref >60)
Glucose, Bld: 95 mg/dL (ref 70–99)
Potassium: 3.9 mmol/L (ref 3.5–5.1)
Sodium: 138 mmol/L (ref 135–145)
Total Bilirubin: 0.4 mg/dL (ref 0.3–1.2)
Total Protein: 8.1 g/dL (ref 6.5–8.1)

## 2018-08-22 LAB — CBC WITH DIFFERENTIAL (CANCER CENTER ONLY)
Abs Immature Granulocytes: 0.01 10*3/uL (ref 0.00–0.07)
Basophils Absolute: 0 10*3/uL (ref 0.0–0.1)
Basophils Relative: 1 %
Eosinophils Absolute: 0.1 10*3/uL (ref 0.0–0.5)
Eosinophils Relative: 1 %
HCT: 26.5 % — ABNORMAL LOW (ref 36.0–46.0)
Hemoglobin: 7.3 g/dL — ABNORMAL LOW (ref 12.0–15.0)
Immature Granulocytes: 0 %
LYMPHS ABS: 2.1 10*3/uL (ref 0.7–4.0)
Lymphocytes Relative: 28 %
MCH: 19.4 pg — ABNORMAL LOW (ref 26.0–34.0)
MCHC: 27.5 g/dL — ABNORMAL LOW (ref 30.0–36.0)
MCV: 70.5 fL — ABNORMAL LOW (ref 80.0–100.0)
Monocytes Absolute: 0.4 10*3/uL (ref 0.1–1.0)
Monocytes Relative: 5 %
Neutro Abs: 4.8 10*3/uL (ref 1.7–7.7)
Neutrophils Relative %: 65 %
Platelet Count: 557 10*3/uL — ABNORMAL HIGH (ref 150–400)
RBC: 3.76 MIL/uL — ABNORMAL LOW (ref 3.87–5.11)
RDW: 19.6 % — ABNORMAL HIGH (ref 11.5–15.5)
WBC Count: 7.4 10*3/uL (ref 4.0–10.5)
nRBC: 0 % (ref 0.0–0.2)

## 2018-08-22 LAB — IRON AND TIBC
Iron: 22 ug/dL — ABNORMAL LOW (ref 41–142)
Saturation Ratios: 5 % — ABNORMAL LOW (ref 21–57)
TIBC: 443 ug/dL (ref 236–444)
UIBC: 421 ug/dL — ABNORMAL HIGH (ref 120–384)

## 2018-08-22 LAB — FERRITIN: Ferritin: <4 ng/mL — ABNORMAL LOW (ref 11–307)

## 2018-08-22 LAB — SAVE SMEAR(SSMR), FOR PROVIDER SLIDE REVIEW

## 2018-08-22 NOTE — Telephone Encounter (Signed)
Referral placed through [facility] with [facility] and they will contact patient to schedule appointment

## 2018-08-24 NOTE — ED Provider Notes (Signed)
[facility] EMERGENCY DEPARTMENT Provider Note   CSN: [id number] Arrival date & time: 08/15/18  1342     History   Chief Complaint Chief Complaint  Patient presents with  . Headache  . Hypertension    HPI Yesenia Mills is a 48 y.o. female.  HPI    48 year old female with hypertension.  Recently started on Norvasc.  She states that she would take it as prescribed and was asked to follow-up if she had a headache.  She has had an intermittent mild headache for the past 2 to 3 days.  She is also had intermittent left shoulder pain for months.  Denies any trauma or strain.  No urinary complaints.  No respiratory complaints.  No unusual swelling. Past Medical History:  Diagnosis Date  . Gastritis   . GERD (gastroesophageal reflux disease)   . Hypertension     Patient Active Problem List   Diagnosis Date Noted  . Iron deficiency anemia 08/22/2018  . History of gastric ulcer 08/22/2018  . Thrombocytosis ([place]) 08/22/2018  . Essential hypertension 08/07/2018  . Heart murmur 08/07/2018  . Epigastric pain 07/20/2015  . Constipation 07/20/2015  . Anemia 07/20/2015  . 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 Pharyngitis 03/28/2013    History reviewed. No pertinent surgical history.   OB History   No obstetric history on file.      Home Medications    Prior to Admission medications   Medication Sig Start Date End Date Taking? Authorizing Provider  amLODipine (NORVASC) 5 MG tablet Take 1 tablet (5 mg total) by mouth at bedtime. 08/07/18   Nche, Charlene Brooke, NP  Dexlansoprazole (DEXILANT PO) Take by mouth.    [provider]  docusate sodium (COLACE) 100 MG capsule Take 1 capsule (100 mg total) by mouth 2 (two) times daily. 08/08/18   Nche, Charlene Brooke, NP  ferrous sulfate 325 (65 FE) MG tablet Take 1 tablet (325 mg total) by mouth 3 (three) times daily with meals. 08/08/18   Nche, Charlene Brooke, NP  omeprazole (PRILOSEC) 40 MG capsule Take 40 mg by  mouth daily.    [provider]  predniSONE (DELTASONE) 10 MG tablet Take 2 tablets (20 mg total) by mouth 2 (two) times daily. 09/07/16   Veryl Speak, MD    Family History Family History  Problem Relation Age of Onset  . Hypertension Mother   . Kidney disease Father   . Heart disease Maternal Grandmother   . Heart failure Maternal Grandmother   . Stroke Maternal Grandmother   . Heart disease Paternal Grandmother   . Heart failure Paternal Grandmother   . Stroke Paternal Grandmother     Social History Social History   Tobacco Use  . Smoking status: Never Smoker  . Smokeless tobacco: Never Used  Substance Use Topics  . Alcohol use: Yes    Alcohol/week: 1.0 standard drinks    Types: 1 Glasses of wine per week    Comment: once in awhile  . Drug use: No     Allergies   Patient has no known allergies.   Review of Systems Review of Systems  All systems reviewed and negative, other than as noted in HPI.  Physical Exam Updated Vital Signs BP 140/78 (BP Location: Right Arm)   Pulse 86   Temp 98.1 F (36.7 C) (Oral)   Resp 16   Ht 5\' 1"  (1.549 m)   Wt 87.5 kg   LMP 07/18/2018 (Within Days)   SpO2 100%  BMI 36.47 kg/m   Physical Exam Vitals signs and nursing note reviewed.  Constitutional:      General: She is not in acute distress.    Appearance: She is well-developed.  HENT:     Head: Normocephalic and atraumatic.  Eyes:     General:        Right eye: No discharge.        Left eye: No discharge.     Conjunctiva/sclera: Conjunctivae normal.  Neck:     Musculoskeletal: Neck supple.  Cardiovascular:     Rate and Rhythm: Normal rate and regular rhythm.     Heart sounds: Normal heart sounds. No murmur. No friction rub. No gallop.   Pulmonary:     Effort: Pulmonary effort is normal. No respiratory distress.     Breath sounds: Normal breath sounds.  Abdominal:     General: There is no distension.     Palpations: Abdomen is soft.     Tenderness:  There is no abdominal tenderness.  Musculoskeletal:        General: No tenderness.  Skin:    General: Skin is warm and dry.  Neurological:     Mental Status: She is alert.  Psychiatric:        Behavior: Behavior normal.        Thought Content: Thought content normal.      ED Treatments / Results  Labs (all labs ordered are listed, but only abnormal results are displayed) Labs Reviewed  BASIC METABOLIC PANEL - Abnormal; Notable for the following components:      Result Value   Glucose, Bld 106 (*)    All other components within normal limits  CBC - Abnormal; Notable for the following components:   RBC 3.86 (*)    Hemoglobin 7.4 (*)    HCT 27.5 (*)    MCV 71.2 (*)    MCH 19.2 (*)    MCHC 26.9 (*)    RDW 20.1 (*)    Platelets 502 (*)    All other components within normal limits  TROPONIN I  PREGNANCY, URINE    EKG EKG Interpretation  Date/Time:  Friday August 15 2018 14:02:57 EST Ventricular Rate:  72 PR Interval:  132 QRS Duration: 90 QT Interval:  388 QTC Calculation: 424 R Axis:   94 Text Interpretation:  Normal sinus rhythm Rightward axis Nonspecific T wave abnormality Abnormal ECG No significant change since last tracing Confirmed by Virgel Manifold [phone]) on 08/15/2018 3:36:59 PM   Radiology No results found.  Procedures Procedures (including critical care time)  Medications Ordered in ED Medications  sodium chloride 0.9 % bolus 1,000 mL (0 mLs Intravenous Stopped 08/15/18 1702)  ketorolac (TORADOL) 15 MG/ML injection 15 mg (15 mg Intravenous Given 08/15/18 1536)  diphenhydrAMINE (BENADRYL) injection 25 mg (25 mg Intravenous Given 08/15/18 1537)  metoCLOPramide (REGLAN) injection 10 mg (10 mg Intravenous Given 08/15/18 1536)     Initial Impression / Assessment and Plan / ED Course  I have reviewed the triage vital signs and the nursing notes.  Pertinent labs & imaging results that were available during my care of the patient were reviewed by me  and considered in my medical decision making (see chart for details).    48 year old female with a mild headache.  No red flags.  Regards to her hypertension, I doubt that this is driving her headache.  She will does have any signs of acute endorgan damage. Final Clinical Impressions(s) / ED Diagnoses   Final diagnoses:  Nonintractable headache, unspecified chronicity pattern, unspecified headache type    ED Discharge Orders    None       Virgel Manifold, MD 08/24/18 [phone]

## 2018-08-26 ENCOUNTER — Encounter: Payer: Self-pay | Admitting: Nurse Practitioner

## 2018-08-26 ENCOUNTER — Ambulatory Visit (INDEPENDENT_AMBULATORY_CARE_PROVIDER_SITE_OTHER): Payer: BLUE CROSS/BLUE SHIELD | Admitting: Nurse Practitioner

## 2018-08-26 VITALS — BP 120/74 | HR 70 | Temp 98.0°F | Ht 60.0 in | Wt 193.4 lb

## 2018-08-26 DIAGNOSIS — N281 Cyst of kidney, acquired: Secondary | ICD-10-CM

## 2018-08-26 DIAGNOSIS — I10 Essential (primary) hypertension: Secondary | ICD-10-CM

## 2018-08-26 MED ORDER — AMLODIPINE BESYLATE 5 MG PO TABS: 5.0000 mg | ORAL_TABLET | Freq: Every day | ORAL | 1 refills | Status: DC

## 2018-08-26 NOTE — Progress Notes (Signed)
Subjective:  Patient ID: Yesenia Mills, female    DOB: 13-Jan-1971  Age: 48 y.o. MRN: [id number]  CC: Follow-up (BP been better, no complaints, FYI went to cancer center--iron infusion 1st treatment 08/29/18 & 2nd treatment 09/05/18, pt will sent message for tdap later. )  HPI  HTN: Improved with Amlodipine and dash diet. No adverse effects with medication. BP Readings from Last 3 Encounters:  08/26/18 120/74  08/22/18 (!) 143/69  08/15/18 140/78   Anemia: Iron deficiency, followed by hematology: Dr. Maylon Peppers Complains of persistent fatigue. Scheduled for iron infusions and also referred to GI  She is concerned about incidental renal cyst found on CT ABD 2016: Mildly complex septated cystic lesion is noted in the upper interpolar region of the left kidney as seen on earlier ultrasound. This measures 1.5 x 1.5 cm on image 30 and measures 18-22 HU.The complex cystic lesion in the left kidney demonstrated on earlier ultrasound is not fully characterized by this examination which does not include pre contrast images. 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 Management options include 6 month follow-up ultrasound to assess stability or dedicated renal MRI (without and with contrast) for more definitive characterization.  No back pain, no hematuria  Reviewed past Medical, Social and Family history today.  Outpatient Medications Prior to Visit  Medication Sig Dispense Refill  . Dexlansoprazole (DEXILANT PO) Take by mouth.    . docusate sodium (COLACE) 100 MG capsule Take 1 capsule (100 mg total) by mouth 2 (two) times daily. 30 capsule 0  . ferrous sulfate 325 (65 FE) MG tablet Take 1 tablet (325 mg total) by mouth 3 (three) times daily with meals. 90 tablet 5  . omeprazole (PRILOSEC) 40 MG capsule Take 40 mg by mouth daily.    . predniSONE (DELTASONE) 10 MG tablet Take 2 tablets (20 mg total) by mouth 2 (two) times daily. 12 tablet 0  . amLODipine (NORVASC)  5 MG tablet Take 1 tablet (5 mg total) by mouth at bedtime. 30 tablet 1   No facility-administered medications prior to visit.    ROS See HPI  Objective:  BP 120/74   Pulse 70   Temp 98 F (36.7 C) (Oral)   Ht 5' (1.524 m)   Wt 193 lb 6.4 oz (87.7 kg)   SpO2 98%   BMI 37.77 kg/m   BP Readings from Last 3 Encounters:  08/26/18 120/74  08/22/18 (!) 143/69  08/15/18 140/78    Wt Readings from Last 3 Encounters:  08/26/18 193 lb 6.4 oz (87.7 kg)  08/22/18 194 lb 12.8 oz (88.4 kg)  08/15/18 193 lb (87.5 kg)    Physical Exam Vitals signs reviewed.  Cardiovascular:     Rate and Rhythm: Normal rate and regular rhythm.     Heart sounds: Murmur present.  Pulmonary:     Effort: Pulmonary effort is normal.     Breath sounds: Normal breath sounds.  Musculoskeletal:     Right lower leg: No edema.     Left lower leg: No edema.  Neurological:     Mental Status: She is alert and oriented to person, place, and time.    Lab Results  Component Value Date   WBC 7.4 08/22/2018   HGB 7.3 (L) 08/22/2018   HCT 26.5 (L) 08/22/2018   PLT 557 (H) 08/22/2018   GLUCOSE 95 08/22/2018   ALT 10 08/22/2018   AST 12 (L) 08/22/2018   NA 138 08/22/2018   K 3.9  08/22/2018   CL 105 08/22/2018   CREATININE 0.91 08/22/2018   BUN 11 08/22/2018   CO2 27 08/22/2018   TSH 1.65 08/07/2018    Dg Chest 2 View  Result Date: 08/15/2018 CLINICAL DATA:  Chest pain EXAM: CHEST - 2 VIEW COMPARISON:  March 04, 2017 FINDINGS: There is no edema or consolidation. Heart is enlarged with pulmonary vascularity normal. No adenopathy. There is lower thoracic dextroscoliosis. IMPRESSION: Mild cardiomegaly.  No edema or consolidation. Electronically Signed   By: Lowella Grip III M.D.   On: 08/15/2018 15:40    Assessment & Plan:   Yesenia Mills was seen today for follow-up.  Diagnoses and all orders for this visit:  Essential hypertension -     amLODipine (NORVASC) 5 MG tablet; Take 1 tablet (5 mg total) by  mouth at bedtime.  Complex renal cyst -     MR ABDOMEN W WO CONTRAST; Future   I am having Yesenia Mills maintain her predniSONE, Dexlansoprazole (DEXILANT PO), omeprazole, docusate sodium, ferrous sulfate, and amLODipine.  Meds ordered this encounter  Medications  . amLODipine (NORVASC) 5 MG tablet    Sig: Take 1 tablet (5 mg total) by mouth at bedtime.    Dispense:  90 tablet    Refill:  1    Order Specific Question:   Supervising Provider    Answer:   Lucille Passy [[id number]]    Problem List Items Addressed This Visit      Cardiovascular and Mediastinum   Essential hypertension - Primary   Relevant Medications   amLODipine (NORVASC) 5 MG tablet    Other Visit Diagnoses    Complex renal cyst       Relevant Orders   MR ABDOMEN W WO CONTRAST      Follow-up: Return in about 3 months (around 11/25/2018) for CPE (fasting).  Wilfred Lacy, NP

## 2018-08-26 NOTE — Patient Instructions (Signed)
Continue current medication.

## 2018-08-29 ENCOUNTER — Inpatient Hospital Stay: Payer: BLUE CROSS/BLUE SHIELD

## 2018-08-29 ENCOUNTER — Other Ambulatory Visit: Payer: Self-pay

## 2018-08-29 VITALS — BP 139/76 | HR 74 | Temp 98.4°F | Resp 18

## 2018-08-29 DIAGNOSIS — D473 Essential (hemorrhagic) thrombocythemia: Secondary | ICD-10-CM | POA: Diagnosis not present

## 2018-08-29 DIAGNOSIS — Z8719 Personal history of other diseases of the digestive system: Secondary | ICD-10-CM | POA: Diagnosis not present

## 2018-08-29 DIAGNOSIS — D509 Iron deficiency anemia, unspecified: Secondary | ICD-10-CM

## 2018-08-29 MED ORDER — ACETAMINOPHEN 325 MG PO TABS
ORAL_TABLET | ORAL | Status: AC
  Filled 2018-08-29: qty 2

## 2018-08-29 MED ORDER — SODIUM CHLORIDE 0.9 % IV SOLN
510.0000 mg | Freq: Once | INTRAVENOUS | Status: AC
  Administered 2018-08-29: 510 mg via INTRAVENOUS
  Filled 2018-08-29: qty 17

## 2018-08-29 MED ORDER — ACETAMINOPHEN 325 MG PO TABS
650.0000 mg | ORAL_TABLET | Freq: Once | ORAL | Status: AC
  Administered 2018-08-29: 650 mg via ORAL

## 2018-08-29 MED ORDER — SODIUM CHLORIDE 0.9 % IV SOLN
Freq: Once | INTRAVENOUS | Status: AC
  Administered 2018-08-29: 08:00:00 via INTRAVENOUS
  Filled 2018-08-29: qty 250

## 2018-08-29 NOTE — Patient Instructions (Signed)

## 2018-08-29 NOTE — Progress Notes (Signed)
Patient complains of headache. Patient states she has had a headache intermittently. Laverna Peace, NP notified. Order given and carried out for Tylenol 650 mg PO.

## 2018-09-01 ENCOUNTER — Ambulatory Visit: Payer: BLUE CROSS/BLUE SHIELD | Admitting: Physician Assistant

## 2018-09-01 ENCOUNTER — Ambulatory Visit: Payer: BLUE CROSS/BLUE SHIELD | Admitting: Gastroenterology

## 2018-09-04 ENCOUNTER — Inpatient Hospital Stay: Admission: RE | Admit: 2018-09-04 | Payer: BLUE CROSS/BLUE SHIELD | Source: Ambulatory Visit

## 2018-09-05 ENCOUNTER — Inpatient Hospital Stay: Payer: BLUE CROSS/BLUE SHIELD

## 2018-09-05 VITALS — BP 145/73 | HR 68 | Temp 98.5°F | Resp 20

## 2018-09-05 DIAGNOSIS — D473 Essential (hemorrhagic) thrombocythemia: Secondary | ICD-10-CM | POA: Diagnosis not present

## 2018-09-05 DIAGNOSIS — D509 Iron deficiency anemia, unspecified: Secondary | ICD-10-CM | POA: Diagnosis not present

## 2018-09-05 DIAGNOSIS — Z8719 Personal history of other diseases of the digestive system: Secondary | ICD-10-CM | POA: Diagnosis not present

## 2018-09-05 MED ORDER — SODIUM CHLORIDE 0.9 % IV SOLN
Freq: Once | INTRAVENOUS | Status: AC
  Administered 2018-09-05: 08:00:00 via INTRAVENOUS
  Filled 2018-09-05: qty 250

## 2018-09-05 MED ORDER — SODIUM CHLORIDE 0.9 % IV SOLN
510.0000 mg | Freq: Once | INTRAVENOUS | Status: AC
  Administered 2018-09-05: 510 mg via INTRAVENOUS
  Filled 2018-09-05: qty 17

## 2018-09-05 NOTE — Patient Instructions (Signed)

## 2018-09-12 ENCOUNTER — Ambulatory Visit: Payer: BLUE CROSS/BLUE SHIELD | Admitting: Gastroenterology

## 2018-09-18 ENCOUNTER — Encounter: Payer: Self-pay | Admitting: Gastroenterology

## 2018-09-18 ENCOUNTER — Ambulatory Visit (INDEPENDENT_AMBULATORY_CARE_PROVIDER_SITE_OTHER): Payer: BLUE CROSS/BLUE SHIELD | Admitting: Gastroenterology

## 2018-09-18 ENCOUNTER — Other Ambulatory Visit: Payer: BLUE CROSS/BLUE SHIELD

## 2018-09-18 VITALS — BP 132/76 | HR 68 | Ht 60.0 in | Wt 195.0 lb

## 2018-09-18 DIAGNOSIS — K219 Gastro-esophageal reflux disease without esophagitis: Secondary | ICD-10-CM

## 2018-09-18 DIAGNOSIS — D509 Iron deficiency anemia, unspecified: Secondary | ICD-10-CM

## 2018-09-18 DIAGNOSIS — Z1211 Encounter for screening for malignant neoplasm of colon: Secondary | ICD-10-CM | POA: Diagnosis not present

## 2018-09-18 DIAGNOSIS — Z8711 Personal history of peptic ulcer disease: Secondary | ICD-10-CM

## 2018-09-18 DIAGNOSIS — R112 Nausea with vomiting, unspecified: Secondary | ICD-10-CM | POA: Diagnosis not present

## 2018-09-18 MED ORDER — NA SULFATE-K SULFATE-MG SULF 17.5-3.13-1.6 GM/177ML PO SOLN: 1.0000 | Freq: Once | ORAL | 0 refills | Status: AC

## 2018-09-18 NOTE — Progress Notes (Signed)
Chief Complaint: IDA, Hx of PUD, Nausea/vomiting  Referring Provider:     Flossie Buffy, NP   HPI:    Yesenia Mills is a 48 y.o. female referred to the Gastroenterology Clinic for evaluation of iron deficiency anemia and history of PUD.  She was evaluated by Dr. Maylon Peppers on 08/22/2018 for her symptomatic IDA (fatigue, DOE, headache), and noted microcytic anemia dating back to 2012 (hemoglobin 10) with slow down shift in hgb to a nadir of 7.2 in 2018 and has remained ~7 since then with concomittant decline in MCV from 82 to high 60's.  Reduced iron indices earlier this month with ferritin <4, iron 22, iron saturation 5%, TIBC 443.  Similarly iron deficient in 06/2015.  She reports she was treated with oral iron therapy, but was intolerant due to GI upset and constipation, so stopped.  Due to significant, symptomatic IDA, she was treated with IV iron on 08/29/2018 and 09/05/2018 with goal ferritin > 50.  Also plan for further evaluation with hemoglobin electrophoresis and Coom[place] test (not yet completed).  Additionally, she reports a prior history of PUD with an EGD approx 4 years ago demonstrating gastric ulcer a few years ago. Was treated with Sucralfate and ?PPI. Had resolution of her index sxs of n/v, abdominal pain. She is referred today to the GI clinic for evaluation of both IDA and history of PUD.  Today she states she feels some improved since her iron infusions x2.  Resolution of ice picca. Modest improvement in fatigue and DOE with iron infusions. She denies any overt GI blood loss.  She denies any NSAIDs.   Additionally, she reports a history of reflux.  She is currently taking omeprazole BID. She was previously treated with Dexilant which controlled her sxs, but changed back to Prilosec for ? Reasons.   She reports daily nausea and non-bloody emesis and MEG pain. Sxs similar to 4 years ago when she reports an ulcer.    Endoscopic history: -EGD a few years ago which she  reports notable for gastric ulcer.  Treated with PPI.  No reports in EMR for review. -Colonoscopy: None per patient  No known family history of CRC, GI malignancy, liver disease, pancreatic disease, or IBD.    Past Medical History:  Diagnosis Date  . Gastritis   . GERD (gastroesophageal reflux disease)   . Hypertension      No past surgical history on file. Family History  Problem Relation Age of Onset  . Hypertension Mother   . Kidney disease Father   . Heart disease Maternal Grandmother   . Heart failure Maternal Grandmother   . Stroke Maternal Grandmother   . Heart disease Paternal Grandmother   . Heart failure Paternal Grandmother   . Stroke Paternal Grandmother    Social History   Tobacco Use  . Smoking status: Never Smoker  . Smokeless tobacco: Never Used  Substance Use Topics  . Alcohol use: Yes    Alcohol/week: 1.0 standard drinks    Types: 1 Glasses of wine per week    Comment: once in awhile  . Drug use: No   Current Outpatient Medications  Medication Sig Dispense Refill  . amLODipine (NORVASC) 5 MG tablet Take 1 tablet (5 mg total) by mouth at bedtime. 90 tablet 1  . Dexlansoprazole (DEXILANT PO) Take by mouth.    . docusate sodium (COLACE) 100 MG capsule Take 1 capsule (100 mg total) by mouth  2 (two) times daily. 30 capsule 0  . ferrous sulfate 325 (65 FE) MG tablet Take 1 tablet (325 mg total) by mouth 3 (three) times daily with meals. 90 tablet 5  . omeprazole (PRILOSEC) 40 MG capsule Take 40 mg by mouth daily.    . predniSONE (DELTASONE) 10 MG tablet Take 2 tablets (20 mg total) by mouth 2 (two) times daily. 12 tablet 0   No current facility-administered medications for this visit.    No Known Allergies   Review of Systems: All systems reviewed and negative except where noted in HPI.     Physical Exam:    Wt Readings from Last 3 Encounters:  08/26/18 193 lb 6.4 oz (87.7 kg)  08/22/18 194 lb 12.8 oz (88.4 kg)  08/15/18 193 lb (87.5 kg)     There were no vitals taken for this visit. Constitutional:  Pleasant, in no acute distress. Psychiatric: Normal mood and affect. Behavior is normal. EENT: Pupils normal.  Conjunctivae are normal. No scleral icterus. Neck supple. No cervical LAD. Cardiovascular: Normal rate, regular rhythm. No edema Pulmonary/chest: Effort normal and breath sounds normal. No wheezing, rales or rhonchi. Abdominal: Soft, nondistended, nontender. Bowel sounds active throughout. There are no masses palpable. No hepatomegaly. Neurological: Alert and oriented to person place and time. Skin: Skin is warm and dry. No rashes noted.   ASSESSMENT AND PLAN;   Yesenia Mills is a 48 y.o. female presenting with:  1) Iron Deficiency Anemia: Discussed etiology for IDA at length, to include occult GI blood loss, malabsorption, and poor dietary iron intake.  Has cut down on meat consumption in the last year or so, but diet is otherwise rich in iron-containing foods and IDA predates the dietary mods, so low suspicion for dietary etiology.  Will evaluate as below:  - EGD with duodenal biopsies - Colonoscopy - Scheduled for repeat iron panel CBC tomorrow - Depending on EGD findings, can consider Celiac serologies - We will send vitamin D, B12, folate to check for co-malabsorption.  We will hold off on additional fat-soluble vitamins at this time pending results - If the above evaluation is unrevealing, will likely proceed with VCE for small bowel interrogation versus cross-sectional imaging - To follow-up in the GI clinic after the above studies are complete  2) GERD: Long history of reflux intermittently controlled with Prilosec.  Was previously better controlled with Dexilant but changed back to Prilosec for unknown reasons. -Evaluate for erosive esophagitis, LES laxity, hiatal hernia at time of EGD as above. -Depending on endoscopic findings, can consider returning to [facility] if unable to control symptoms  adequately with current PPI therapy  3) History of PUD: Patient reports history of PUD on EGD a few years ago.  Will evaluate for PUD, gastritis, etc. at time of EGD as above -EGD with gastric biopsies  4) Nausea/vomiting: Evaluate for luminal/mucosal etiology at time of EGD as above  5) CRC screening: No previous history of CRC screening.  Due for age-appropriate screening in this African-American female. -Colonoscopy for CRC screening in addition to evaluation for IDA as noted above -Recommendations for ongoing screening/surveillance interval ending colonoscopy   The indications, risks, and benefits of EGD and colonoscopy were explained to the patient in detail. Risks include but are not limited to bleeding, perforation, adverse reaction to medications, and cardiopulmonary compromise. Sequelae include but are not limited to the possibility of surgery, hositalization, and mortality. The patient verbalized understanding and wished to proceed. All questions answered, referred to scheduler and bowel  prep ordered. Further recommendations pending results of the exam.     Lavena Bullion, DO, FACG  09/18/2018, 8:47 AM   Nche, Charlene Brooke, NP

## 2018-09-18 NOTE — Patient Instructions (Signed)
If you are age 49 or older, your body mass index should be between 23-30. Your Body mass index is 38.08 kg/m. If this is out of the aforementioned range listed, please consider follow up with your Primary Care Provider.  If you are age 30 or younger, your body mass index should be between 19-25. Your Body mass index is 38.08 kg/m. If this is out of the aformentioned range listed, please consider follow up with your Primary Care Provider.   You have been scheduled for an endoscopy and colonoscopy. Please follow the written instructions given to you at your visit today. Please pick up your prep supplies at the pharmacy within the next 1-3 days. If you use inhalers (even only as needed), please bring them with you on the day of your procedure. Your physician has requested that you go to www.startemmi.com and enter the access code given to you at your visit today. This web site gives a general overview about your procedure. However, you should still follow specific instructions given to you by our office regarding your preparation for the procedure.  Please go to the lab on the 2nd floor [address] before you leave the office today.   We have sent the following medications to your pharmacy for you to pick up at your convenience: Suprep  It was a pleasure to see you today!  Vito Cirigliano, D.O.

## 2018-09-18 NOTE — Progress Notes (Addendum)
[facility] OFFICE PROGRESS NOTE  Patient Care Team: Nche, Charlene Brooke, NP as PCP - General (Internal Medicine)  HEME/ONC OVERVIEW: 1. Iron deficiency anemia, possibly due to GI blood loss   TREATMENT SUMMARY:  PRN IV iron, last in 08/2018   ASSESSMENT & PLAN:   Iron deficiency anemia -Patient received IV iron x 2 in early 08/2018, no significant infusion reactions -Hgb 9.6 today; ferritin 384 w/ improving serum iron and saturation  -Give the improvement in Hgb and adequate iron storage (goal ferritin >50), there is no indication for further IV iron transfusion at this time -Hemoglobin electrophoresis and [place] test pending -We will plan to see the patient in 6 weeks and reassss her Hgb and iron profile, and determine if she requires further IV iron treatment then -Of note, patient cannot tolerate oral iron supplement due to GI side effects  Thrombocytosis -Most likely secondary to iron deficiency anemia -Plts 412k, improving  -We will monitor it for now  Hx of gastric ulcer  -Patient was referred to GI for further of possible GI bleeding, given her hx of peptic ulcer disease -She is scheduled for EGD and colonoscopy in early 09/2018 with Dr. Melanee Left; appreciate his assistance -I encouraged the patient to follow up with GI as scheduled to rule out GI bleeding  Orders Placed This Encounter  Procedures  . CBC with Differential (Cancer Center Only)    Standing Status:   Future    Standing Expiration Date:   10/24/2019  . Ferritin    Standing Status:   Future    Standing Expiration Date:   10/24/2019  . Iron and TIBC    Standing Status:   Future    Standing Expiration Date:   10/24/2019   All questions were answered. The patient knows to call the clinic with any problems, questions or concerns. No barriers to learning was detected.  A total of more than 25 minutes were spent face-to-face with the patient during this encounter and over half of that time was  spent on counseling and coordination of care as outlined above.   Return in 6 weeks for labs and clinic follow-up.   Tish Men, MD 09/19/2018 1:57 PM  CHIEF COMPLAINT: "I am doing a little better "  INTERVAL HISTORY: Yesenia Mills returns to clinic for follow-up of iron deficiency anemia.  Patient reports that she tolerated IV iron infusion well without significant side effects.  Since the IV iron, her craving for iron has resolved, and her energy level has modestly improved, but she is still very fatigued and unable to do much around the house.  She has intermittent vomiting, usually after eating, but she denies any significant abdominal pain, hematemesis, hematochezia, or melena.  She was recently seen by gastroenterology, and has been scheduled for EGD and colonoscopy on 09/23/2018.  Patient denies any other complaint today.  REVIEW OF SYSTEMS:   Constitutional: ( - ) fevers, ( - )  chills , ( - ) night sweats Eyes: ( - ) blurriness of vision, ( - ) double vision, ( - ) watery eyes Ears, nose, mouth, throat, and face: ( - ) mucositis, ( - ) sore throat Respiratory: ( - ) cough, ( - ) dyspnea, ( - ) wheezes Cardiovascular: ( - ) palpitation, ( - ) chest discomfort, ( - ) lower extremity swelling Gastrointestinal:  ( + ) nausea, ( - ) heartburn, ( - ) change in bowel habits Skin: ( - ) abnormal skin rashes Lymphatics: ( - )  new lymphadenopathy, ( - ) easy bruising Neurological: ( - ) numbness, ( - ) tingling, ( - ) new weaknesses Behavioral/Psych: ( - ) mood change, ( - ) new changes  All other systems were reviewed with the patient and are negative.  I have reviewed the past medical history, past surgical history, social history and family history with the patient and they are unchanged from previous note.  ALLERGIES:  has No Known Allergies.  MEDICATIONS:  Current Outpatient Medications  Medication Sig Dispense Refill  . amLODipine (NORVASC) 5 MG tablet Take 1 tablet (5 mg total) by  mouth at bedtime. 90 tablet 1  . ferrous sulfate 325 (65 FE) MG tablet Take 325 mg by mouth 3 (three) times daily with meals.    Marland Kitchen omeprazole (PRILOSEC) 40 MG capsule Take 40 mg by mouth daily.     No current facility-administered medications for this visit.     PHYSICAL EXAMINATION: ECOG PERFORMANCE STATUS: 2 - Symptomatic, <50% confined to bed  Today's Vitals   09/19/18 0952 09/19/18 0953  BP: (!) 150/81   Pulse: 69   Resp: 18   Temp: 98.4 F (36.9 C)   TempSrc: Oral   SpO2: 100%   Weight: 196 lb (88.9 kg)   Height: 5' (1.524 m)   PainSc: 0-No pain 0-No pain   Body mass index is 38.28 kg/m.  Filed Weights   09/19/18 0952  Weight: 196 lb (88.9 kg)    GENERAL: alert, no distress and comfortable, general malaise  SKIN: skin color, texture, turgor are normal, no rashes or significant lesions EYES: conjunctiva are pink and non-injected, sclera clear OROPHARYNX: no exudate, no erythema; lips, buccal mucosa, and tongue normal  NECK: supple, non-tender LUNGS: clear to auscultation with normal breathing effort HEART: regular rate & rhythm and no murmurs and no lower extremity edema ABDOMEN: soft, non-tender, non-distended, normal bowel sounds Musculoskeletal: no cyanosis of digits and no clubbing  PSYCH: alert & oriented x 3, fluent speech NEURO: no focal motor/sensory deficits  LABORATORY DATA:  I have reviewed the data as listed    Component Value Date/Time   NA 138 08/22/2018 0835   K 3.9 08/22/2018 0835   CL 105 08/22/2018 0835   CO2 27 08/22/2018 0835   GLUCOSE 95 08/22/2018 0835   BUN 11 08/22/2018 0835   CREATININE 0.91 08/22/2018 0835   CREATININE 0.82 07/20/2015 1148   CALCIUM 9.2 08/22/2018 0835   PROT 8.1 08/22/2018 0835   ALBUMIN 4.1 08/22/2018 0835   AST 12 (L) 08/22/2018 0835   ALT 10 08/22/2018 0835   ALKPHOS 84 08/22/2018 0835   BILITOT 0.4 08/22/2018 0835   GFRNONAA >60 08/22/2018 0835   GFRAA >60 08/22/2018 0835    No results found for:  SPEP, UPEP  Lab Results  Component Value Date   WBC 5.9 09/19/2018   NEUTROABS 3.8 09/19/2018   HGB 9.6 (L) 09/19/2018   HCT 31.3 (L) 09/19/2018   MCV 79.2 (L) 09/19/2018   PLT 412 (H) 09/19/2018      Chemistry      Component Value Date/Time   NA 138 08/22/2018 0835   K 3.9 08/22/2018 0835   CL 105 08/22/2018 0835   CO2 27 08/22/2018 0835   BUN 11 08/22/2018 0835   CREATININE 0.91 08/22/2018 0835   CREATININE 0.82 07/20/2015 1148      Component Value Date/Time   CALCIUM 9.2 08/22/2018 0835   ALKPHOS 84 08/22/2018 0835   AST 12 (L) 08/22/2018 7989  ALT 10 08/22/2018 0835   BILITOT 0.4 08/22/2018 0835

## 2018-09-19 ENCOUNTER — Telehealth: Payer: Self-pay | Admitting: Gastroenterology

## 2018-09-19 ENCOUNTER — Other Ambulatory Visit: Payer: Self-pay

## 2018-09-19 ENCOUNTER — Inpatient Hospital Stay (HOSPITAL_BASED_OUTPATIENT_CLINIC_OR_DEPARTMENT_OTHER): Payer: BLUE CROSS/BLUE SHIELD | Admitting: Hematology

## 2018-09-19 ENCOUNTER — Encounter: Payer: Self-pay | Admitting: Hematology

## 2018-09-19 ENCOUNTER — Inpatient Hospital Stay: Payer: BLUE CROSS/BLUE SHIELD

## 2018-09-19 ENCOUNTER — Other Ambulatory Visit: Payer: Self-pay | Admitting: Hematology

## 2018-09-19 VITALS — BP 150/81 | HR 69 | Temp 98.4°F | Resp 18 | Ht 60.0 in | Wt 196.0 lb

## 2018-09-19 DIAGNOSIS — D75839 Thrombocytosis, unspecified: Secondary | ICD-10-CM

## 2018-09-19 DIAGNOSIS — D473 Essential (hemorrhagic) thrombocythemia: Secondary | ICD-10-CM

## 2018-09-19 DIAGNOSIS — R5383 Other fatigue: Secondary | ICD-10-CM

## 2018-09-19 DIAGNOSIS — Z8711 Personal history of peptic ulcer disease: Secondary | ICD-10-CM

## 2018-09-19 DIAGNOSIS — Z8719 Personal history of other diseases of the digestive system: Secondary | ICD-10-CM

## 2018-09-19 DIAGNOSIS — D509 Iron deficiency anemia, unspecified: Secondary | ICD-10-CM

## 2018-09-19 LAB — CBC WITH DIFFERENTIAL (CANCER CENTER ONLY)
Abs Immature Granulocytes: 0.01 10*3/uL (ref 0.00–0.07)
BASOS PCT: 0 %
Basophils Absolute: 0 10*3/uL (ref 0.0–0.1)
EOS PCT: 2 %
Eosinophils Absolute: 0.1 10*3/uL (ref 0.0–0.5)
HCT: 31.3 % — ABNORMAL LOW (ref 36.0–46.0)
Hemoglobin: 9.6 g/dL — ABNORMAL LOW (ref 12.0–15.0)
Immature Granulocytes: 0 %
Lymphocytes Relative: 29 %
Lymphs Abs: 1.7 10*3/uL (ref 0.7–4.0)
MCH: 24.3 pg — ABNORMAL LOW (ref 26.0–34.0)
MCHC: 30.7 g/dL (ref 30.0–36.0)
MCV: 79.2 fL — ABNORMAL LOW (ref 80.0–100.0)
Monocytes Absolute: 0.3 10*3/uL (ref 0.1–1.0)
Monocytes Relative: 5 %
Neutro Abs: 3.8 10*3/uL (ref 1.7–7.7)
Neutrophils Relative %: 64 %
Platelet Count: 412 10*3/uL — ABNORMAL HIGH (ref 150–400)
RBC: 3.95 MIL/uL (ref 3.87–5.11)
WBC Count: 5.9 10*3/uL (ref 4.0–10.5)
nRBC: 0 % (ref 0.0–0.2)

## 2018-09-19 LAB — IRON AND TIBC
Iron: 64 ug/dL (ref 41–142)
Saturation Ratios: 24 % (ref 21–57)
TIBC: 273 ug/dL (ref 236–444)
UIBC: 208 ug/dL (ref 120–384)

## 2018-09-19 LAB — DIRECT ANTIGLOBULIN TEST (NOT AT ARMC)
DAT, IgG: NEGATIVE
DAT, complement: NEGATIVE

## 2018-09-19 LAB — VITAMIN B12: Vitamin B-12: 263 pg/mL (ref 180–914)

## 2018-09-19 LAB — FERRITIN: Ferritin: 384 ng/mL — ABNORMAL HIGH (ref 11–307)

## 2018-09-19 NOTE — Telephone Encounter (Signed)
Pt requested a coupon for Suprep.  Please fax coupon to [facility]: [phone] with pt's name and DOB attached.

## 2018-09-20 LAB — FOLATE: Folate: 12 ng/mL (ref >5.9)

## 2018-09-20 LAB — VITAMIN D 25 HYDROXY (VIT D DEFICIENCY, FRACTURES): Vit D, 25-Hydroxy: 8.8 ng/mL — ABNORMAL LOW (ref 30.0–100.0)

## 2018-09-22 LAB — HEMOGLOBINOPATHY EVALUATION
Hgb A2 Quant: 1.9 % (ref 1.8–3.2)
Hgb A: 98.1 % (ref 96.4–98.8)
Hgb C: 0 %
Hgb F Quant: 0 % (ref 0.0–2.0)
Hgb S Quant: 0 %
Hgb Variant: 0 %

## 2018-09-22 NOTE — Telephone Encounter (Signed)
Co-pay card sent to [facility].

## 2018-09-23 ENCOUNTER — Telehealth: Payer: Self-pay | Admitting: *Deleted

## 2018-09-23 ENCOUNTER — Ambulatory Visit (AMBULATORY_SURGERY_CENTER): Payer: BLUE CROSS/BLUE SHIELD | Admitting: Gastroenterology

## 2018-09-23 ENCOUNTER — Encounter: Payer: Self-pay | Admitting: Gastroenterology

## 2018-09-23 VITALS — BP 157/75 | HR 78 | Temp 98.0°F | Resp 12

## 2018-09-23 DIAGNOSIS — D128 Benign neoplasm of rectum: Secondary | ICD-10-CM

## 2018-09-23 DIAGNOSIS — R1013 Epigastric pain: Secondary | ICD-10-CM

## 2018-09-23 DIAGNOSIS — K219 Gastro-esophageal reflux disease without esophagitis: Secondary | ICD-10-CM

## 2018-09-23 DIAGNOSIS — K2980 Duodenitis without bleeding: Secondary | ICD-10-CM

## 2018-09-23 DIAGNOSIS — K222 Esophageal obstruction: Secondary | ICD-10-CM

## 2018-09-23 DIAGNOSIS — K621 Rectal polyp: Secondary | ICD-10-CM

## 2018-09-23 DIAGNOSIS — Z8711 Personal history of peptic ulcer disease: Secondary | ICD-10-CM

## 2018-09-23 DIAGNOSIS — D509 Iron deficiency anemia, unspecified: Secondary | ICD-10-CM

## 2018-09-23 MED ORDER — SODIUM CHLORIDE 0.9 % IV SOLN: 500.0000 mL | Freq: Once | INTRAVENOUS | Status: DC

## 2018-09-23 NOTE — Patient Instructions (Signed)
YOU HAD AN ENDOSCOPIC PROCEDURE TODAY AT [facility]:   Refer to the procedure report that was given to you for any specific questions about what was found during the examination.  If the procedure report does not answer your questions, please call your gastroenterologist to clarify.  If you requested that your care partner not be given the details of your procedure findings, then the procedure report has been included in a sealed envelope for you to review at your convenience later.  **Handouts given on polyps**   YOU SHOULD EXPECT: Some feelings of bloating in the abdomen. Passage of more gas than usual.  Walking can help get rid of the air that was put into your GI tract during the procedure and reduce the bloating. If you had a lower endoscopy (such as a colonoscopy or flexible sigmoidoscopy) you may notice spotting of blood in your stool or on the toilet paper. If you underwent a bowel prep for your procedure, you may not have a normal bowel movement for a few days.  Please Note:  You might notice some irritation and congestion in your nose or some drainage.  This is from the oxygen used during your procedure.  There is no need for concern and it should clear up in a day or so.  SYMPTOMS TO REPORT IMMEDIATELY:   Following lower endoscopy (colonoscopy or flexible sigmoidoscopy):  Excessive amounts of blood in the stool  Significant tenderness or worsening of abdominal pains  Swelling of the abdomen that is new, acute  Fever of 100F or higher   Following upper endoscopy (EGD)  Vomiting of blood or coffee ground material  New chest pain or pain under the shoulder blades  Painful or persistently difficult swallowing  New shortness of breath  Fever of 100F or higher  Black, tarry-looking stools  For urgent or emergent issues, a gastroenterologist can be reached at any hour by calling [phone].   DIET:  We do recommend a small meal at first, but then you may  proceed to your regular diet.  Drink plenty of fluids but you should avoid alcoholic beverages for 24 hours.  ACTIVITY:  You should plan to take it easy for the rest of today and you should NOT DRIVE or use heavy machinery until tomorrow (because of the sedation medicines used during the test).    FOLLOW UP: Our staff will call the number listed on your records the next business day following your procedure to check on you and address any questions or concerns that you may have regarding the information given to you following your procedure. If we do not reach you, we will leave a message.  However, if you are feeling well and you are not experiencing any problems, there is no need to return our call.  We will assume that you have returned to your regular daily activities without incident.  If any biopsies were taken you will be contacted by phone or by letter within the next 1-3 weeks.  Please call us at [phone] if you have not heard about the biopsies in 3 weeks.    SIGNATURES/CONFIDENTIALITY: You and/or your care partner have signed paperwork which will be entered into your electronic medical record.  These signatures attest to the fact that that the information above on your After Visit Summary has been reviewed and is understood.  Full responsibility of the confidentiality of this discharge information lies with you and/or your care-partner.

## 2018-09-23 NOTE — Progress Notes (Signed)
Pt's states no medical or surgical changes since previsit or office visit. 

## 2018-09-23 NOTE — Telephone Encounter (Signed)
Per MD, notified pt of lab results.

## 2018-09-23 NOTE — Progress Notes (Signed)
Report given to PACU, vss 

## 2018-09-23 NOTE — Op Note (Signed)
[facility] Patient Name: Yesenia Mills Procedure Date: 09/23/2018 3:03 PM MRN: [id number] Endoscopist: Gerrit Heck , MD Age: 48 Referring MD:  Date of Birth: 1970-12-01 Gender: Female Account #: [id number] Procedure:                Upper GI endoscopy Indications:              Epigastric abdominal pain, Iron deficiency anemia                           48 yo female with symptomatic IDA (fatigue, DOE,                            headaches) without overt GI blood loss. Responsive                            to IV iron therapy. Medicines:                Monitored Anesthesia Care Procedure:                Pre-Anesthesia Assessment:                           - Prior to the procedure, a History and Physical                            was performed, and patient medications and                            allergies were reviewed. The patient's tolerance of                            previous anesthesia was also reviewed. The risks                            and benefits of the procedure and the sedation                            options and risks were discussed with the patient.                            All questions were answered, and informed consent                            was obtained. Prior Anticoagulants: The patient has                            taken no previous anticoagulant or antiplatelet                            agents. ASA Grade Assessment: II - A patient with                            mild systemic disease. After reviewing the risks  and benefits, the patient was deemed in                            satisfactory condition to undergo the procedure.                           After obtaining informed consent, the endoscope was                            passed under direct vision. Throughout the                            procedure, the patient's blood pressure, pulse, and                            oxygen saturations were monitored  continuously. The                            Endoscope was introduced through the mouth, and                            advanced to the second part of duodenum. The upper                            GI endoscopy was accomplished without difficulty.                            The patient tolerated the procedure well. Scope In: Scope Out: Findings:                 A non-obstructing and mild Schatzki ring was found                            in the lower third of the esophagus. This was                            easily traversed.                           The examined esophagus was normal.                           The entire examined stomach was normal. Biopsies                            were taken with a cold forceps for Helicobacter                            pylori testing. Estimated blood loss was minimal.                           The duodenal bulb, first portion of the duodenum                            and second  portion of the duodenum were normal. Complications:            No immediate complications. Estimated Blood Loss:     Estimated blood loss was minimal. Impression:               - Non-obstructing and mild Schatzki ring.                           - Normal esophagus.                           - Normal stomach. Biopsied.                           - Normal duodenal bulb, first portion of the                            duodenum and second portion of the duodenum. Recommendation:           - Patient has a contact number available for                            emergencies. The signs and symptoms of potential                            delayed complications were discussed with the                            patient. Return to normal activities tomorrow.                            Written discharge instructions were provided to the                            patient.                           - Resume previous diet today.                           - Continue present  medications.                           - Await pathology results.                           - Perform a colonoscopy today.                           - Return to GI clinic in 2 months.                           - Depending on the biopsy results, may need to                            evaluate the small bowel further with Video Capsule  Endoscopy. Gerrit Heck, MD 09/23/2018 3:41:13 PM

## 2018-09-23 NOTE — Op Note (Signed)
[facility] Patient Name: Yesenia Mills Procedure Date: 09/23/2018 3:03 PM MRN: [id number] Endoscopist: Gerrit Heck , MD Age: 48 Referring MD:  Date of Birth: 10/17/1970 Gender: Female Account #: [id number] Procedure:                Colonoscopy Indications:              Iron deficiency anemia                           48 yo female with symptomatic IDA (fatigue, DOE,                            headaches) without overt GI blood loss. Responsive                            to IV iron therapy. No previous colonoscopy. Medicines:                Monitored Anesthesia Care Procedure:                Pre-Anesthesia Assessment:                           - Prior to the procedure, a History and Physical                            was performed, and patient medications and                            allergies were reviewed. The patient's tolerance of                            previous anesthesia was also reviewed. The risks                            and benefits of the procedure and the sedation                            options and risks were discussed with the patient.                            All questions were answered, and informed consent                            was obtained. Prior Anticoagulants: The patient has                            taken no previous anticoagulant or antiplatelet                            agents. ASA Grade Assessment: II - A patient with                            mild systemic disease. After reviewing the risks  and benefits, the patient was deemed in                            satisfactory condition to undergo the procedure.                           After obtaining informed consent, the colonoscope                            was passed under direct vision. Throughout the                            procedure, the patient's blood pressure, pulse, and                            oxygen saturations were monitored  continuously. The                            Colonoscope was introduced through the anus and                            advanced to the the terminal ileum. The colonoscopy                            was performed without difficulty. The patient                            tolerated the procedure well. The quality of the                            bowel preparation was adequate. The terminal ileum,                            ileocecal valve, appendiceal orifice, and rectum                            were photographed. Scope In: 3:23:39 PM Scope Out: 3:34:44 PM Scope Withdrawal Time: 0 hours 9 minutes 48 seconds  Total Procedure Duration: 0 hours 11 minutes 5 seconds  Findings:                 The perianal and digital rectal examinations were                            normal.                           A 2 mm polyp was found in the rectum                            (benign-appearing lesion). The polyp was sessile.                            The polyp was removed with a cold biopsy forceps.  Resection and retrieval were complete. Estimated                            blood loss was minimal.                           The recto-sigmoid colon, sigmoid colon, descending                            colon, splenic flexure, transverse colon, hepatic                            flexure, ascending colon and cecum appeared normal.                            There were no areas of mucosal erythema, edema,                            erosions, ulceration, and there was no active                            bleeding nor stigmata of recent bleeding noted on                            this study.                           The terminal ileum appeared normal.                           The retroflexed view of the distal rectum and anal                            verge was normal and showed no anal or rectal                            abnormalities. Complications:            No immediate  complications. Estimated Blood Loss:     Estimated blood loss was minimal. Impression:               - One benign appearing 2 mm polyp in the rectum,                            removed with a cold biopsy forceps. Resected and                            retrieved.                           - The recto-sigmoid colon, sigmoid colon,                            descending colon, splenic flexure, transverse  colon, hepatic flexure, ascending colon and cecum                            are normal.                           - The examined portion of the ileum was normal.                           - The distal rectum and anal verge are normal on                            retroflexion view. Recommendation:           - Patient has a contact number available for                            emergencies. The signs and symptoms of potential                            delayed complications were discussed with the                            patient. Return to normal activities tomorrow.                            Written discharge instructions were provided to the                            patient.                           - Resume previous diet today.                           - Continue present medications.                           - Await pathology results.                           - Repeat colonoscopy in 5-10 years for surveillance                            based on pathology results.                           - Return to GI clinic in 2 months.                           - Depending on the results of the biopsies from the                            upper endoscopy, may need to consider further  evaluation of the small bowel with video capsule                            endoscopy. Gerrit Heck, MD 09/23/2018 3:46:55 PM

## 2018-09-24 ENCOUNTER — Telehealth: Payer: Self-pay

## 2018-09-24 NOTE — Telephone Encounter (Signed)
Follow up call made, left a voicemail. 

## 2018-09-24 NOTE — Telephone Encounter (Signed)
  Follow up Call-  Call back number 09/23/2018  Post procedure Call Back phone  # [phone]  Permission to leave phone message Yes  Some recent data might be hidden     Patient questions:  Do you have a fever, pain , or abdominal swelling? Yes  Pain Score  3 *  Stomachache-drinking po warm liquids now Will call us back if no improvement  Have you tolerated food without any problems? Yes.    Have you been able to return to your normal activities? Yes.    Do you have any questions about your discharge instructions: Diet   No. Medications  No. Follow up visit  No.  Do you have questions or concerns about your Care? No.  Actions: * If pain score is 4 or above: No action needed, pain <4.

## 2018-09-26 ENCOUNTER — Ambulatory Visit: Payer: BLUE CROSS/BLUE SHIELD

## 2018-09-29 ENCOUNTER — Encounter: Payer: Self-pay | Admitting: Gastroenterology

## 2018-09-29 ENCOUNTER — Other Ambulatory Visit: Payer: Self-pay

## 2018-09-29 DIAGNOSIS — D509 Iron deficiency anemia, unspecified: Secondary | ICD-10-CM

## 2018-10-03 ENCOUNTER — Ambulatory Visit: Payer: BLUE CROSS/BLUE SHIELD

## 2018-11-01 ENCOUNTER — Other Ambulatory Visit: Payer: Self-pay

## 2018-11-01 ENCOUNTER — Emergency Department (HOSPITAL_BASED_OUTPATIENT_CLINIC_OR_DEPARTMENT_OTHER)
Admission: EM | Admit: 2018-11-01 | Discharge: 2018-11-01 | Disposition: A | Discharge status: Home / Self Care | Payer: Commercial Managed Care - PPO | Attending: Emergency Medicine | Admitting: Emergency Medicine

## 2018-11-01 ENCOUNTER — Encounter (HOSPITAL_BASED_OUTPATIENT_CLINIC_OR_DEPARTMENT_OTHER): Payer: Self-pay | Admitting: Emergency Medicine

## 2018-11-01 DIAGNOSIS — H5713 Ocular pain, bilateral: Secondary | ICD-10-CM | POA: Diagnosis present

## 2018-11-01 DIAGNOSIS — H11423 Conjunctival edema, bilateral: Secondary | ICD-10-CM | POA: Diagnosis not present

## 2018-11-01 DIAGNOSIS — Z79899 Other long term (current) drug therapy: Secondary | ICD-10-CM | POA: Diagnosis not present

## 2018-11-01 DIAGNOSIS — Z77098 Contact with and (suspected) exposure to other hazardous, chiefly nonmedicinal, chemicals: Secondary | ICD-10-CM | POA: Insufficient documentation

## 2018-11-01 HISTORY — DX: Iron deficiency anemia, unspecified: D50.9

## 2018-11-01 MED ORDER — FLUORESCEIN SODIUM 1 MG OP STRP
1.0000 | ORAL_STRIP | Freq: Once | OPHTHALMIC | Status: DC
  Filled 2018-11-01: qty 1

## 2018-11-01 MED ORDER — TETRACAINE HCL 0.5 % OP SOLN
2.0000 [drp] | Freq: Once | OPHTHALMIC | Status: DC
  Filled 2018-11-01: qty 4

## 2018-11-01 MED ORDER — HYDROMORPHONE HCL 1 MG/ML IJ SOLN
1.0000 mg | Freq: Once | INTRAMUSCULAR | Status: AC
  Administered 2018-11-01: 1 mg via INTRAMUSCULAR
  Filled 2018-11-01: qty 1

## 2018-11-01 MED ORDER — ERYTHROMYCIN 5 MG/GM OP OINT
TOPICAL_OINTMENT | Freq: Once | OPHTHALMIC | Status: AC
  Administered 2018-11-01: 08:00:00 via OPHTHALMIC
  Filled 2018-11-01: qty 3.5

## 2018-11-01 MED ORDER — ERYTHROMYCIN 5 MG/GM OP OINT: TOPICAL_OINTMENT | OPHTHALMIC | 0 refills | Status: DC

## 2018-11-01 MED ORDER — ONDANSETRON 4 MG PO TBDP
4.0000 mg | ORAL_TABLET | Freq: Once | ORAL | Status: AC
  Administered 2018-11-01: 4 mg via ORAL
  Filled 2018-11-01: qty 1

## 2018-11-01 MED ORDER — OXYCODONE-ACETAMINOPHEN 5-325 MG PO TABS
2.0000 | ORAL_TABLET | Freq: Once | ORAL | Status: AC
  Administered 2018-11-01: 2 via ORAL
  Filled 2018-11-01: qty 2

## 2018-11-01 NOTE — ED Provider Notes (Signed)
[facility] EMERGENCY DEPARTMENT Provider Note   CSN: [id number] Arrival date & time: 11/01/18  0203    History   Chief Complaint Chief Complaint  Patient presents with  . Eye Problem    HPI Yesenia Mills is a 48 y.o. female.     Patient complains of bilateral eye pain after splashing bleach in her eye about 1 hour ago.  States she was trying to clean the house by pouring bleach into a bucket of water when it splashed up into her face.  It got in both of her eyes.  She immediately took out her contacts and flushed her eyes with warm water.  This happened about 1 AM. She complains of bilateral eye pain and blurry vision. Denies any difficulty breathing or difficulty swallowing.  Her tetanus shot is up-to-date.  The history is provided by the patient.  Eye Problem  Associated symptoms: photophobia and redness   Associated symptoms: no headaches, no nausea and no vomiting     Past Medical History:  Diagnosis Date  . Gastritis   . GERD (gastroesophageal reflux disease)   . Hypertension   . Iron deficiency anemia     Patient Active Problem List   Diagnosis Date Noted  . Iron deficiency anemia 08/22/2018  . History of gastric ulcer 08/22/2018  . Thrombocytosis ([place]) 08/22/2018  . Essential hypertension 08/07/2018  . Heart murmur 08/07/2018  . Epigastric pain 07/20/2015  . Constipation 07/20/2015  . Anemia 07/20/2015  . 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 Pharyngitis 03/28/2013    History reviewed. No pertinent surgical history.   OB History   No obstetric history on file.      Home Medications    Prior to Admission medications   Medication Sig Start Date End Date Taking? Authorizing Provider  amLODipine (NORVASC) 5 MG tablet Take 1 tablet (5 mg total) by mouth at bedtime. 08/26/18   Nche, Charlene Brooke, NP  omeprazole (PRILOSEC) 40 MG capsule Take 40 mg by mouth daily.    [provider]    Family History Family History  Problem Relation  Age of Onset  . Hypertension Mother   . Kidney disease Father   . Heart disease Maternal Grandmother   . Heart failure Maternal Grandmother   . Stroke Maternal Grandmother   . Heart disease Paternal Grandmother   . Heart failure Paternal Grandmother   . Stroke Paternal Grandmother   . Colon cancer Neg Hx     Social History Social History   Tobacco Use  . Smoking status: Never Smoker  . Smokeless tobacco: Never Used  Substance Use Topics  . Alcohol use: Yes    Alcohol/week: 1.0 standard drinks    Types: 1 Glasses of wine per week    Comment: once in awhile  . Drug use: No     Allergies   Patient has no known allergies.   Review of Systems Review of Systems  Constitutional: Negative for activity change, appetite change and fever.  Eyes: Positive for photophobia, pain, redness and visual disturbance.  Respiratory: Negative for cough, chest tightness and shortness of breath.   Cardiovascular: Negative for chest pain.  Gastrointestinal: Negative for abdominal pain, nausea and vomiting.  Genitourinary: Negative for dysuria and hematuria.  Musculoskeletal: Negative for arthralgias and myalgias.  Skin: Negative for rash.  Neurological: Negative for headaches.    all other systems are negative except as noted in the HPI and PMH.   Physical Exam Updated Vital Signs BP (!) 179/91 (BP  Location: Right Arm)   Pulse 83   Temp 98.6 F (37 C) (Oral)   Resp 18   Ht 5' (1.524 m)   Wt 86.2 kg   SpO2 100%   BMI 37.11 kg/m   Physical Exam Vitals signs and nursing note reviewed.  Constitutional:      General: She is not in acute distress.    Appearance: She is well-developed.  HENT:     Head: Normocephalic and atraumatic.     Mouth/Throat:     Pharynx: No oropharyngeal exudate.  Eyes:     General: Lids are normal. Lids are everted, no foreign bodies appreciated. Gaze aligned appropriately.     Extraocular Movements: Extraocular movements intact.     Conjunctiva/sclera:      Right eye: Right conjunctiva is injected. Chemosis present.     Left eye: Left conjunctiva is injected. Chemosis present.     Pupils: Pupils are equal, round, and reactive to light.     Right eye: No corneal abrasion or fluorescein uptake.     Left eye: No corneal abrasion or fluorescein uptake.     Slit lamp exam:    Right eye: No hyphema or hypopyon.     Left eye: No hyphema or hypopyon.     Comments: No areas of fluoroscein uptake.  Neck:     Musculoskeletal: Normal range of motion and neck supple.     Comments: No meningismus. Cardiovascular:     Rate and Rhythm: Normal rate and regular rhythm.     Heart sounds: Normal heart sounds. No murmur.  Pulmonary:     Effort: Pulmonary effort is normal. No respiratory distress.     Breath sounds: Normal breath sounds.  Abdominal:     Palpations: Abdomen is soft.     Tenderness: There is no abdominal tenderness. There is no guarding or rebound.  Musculoskeletal: Normal range of motion.        General: No tenderness.  Skin:    General: Skin is warm.  Neurological:     Mental Status: She is alert and oriented to person, place, and time.     Cranial Nerves: No cranial nerve deficit.     Motor: No abnormal muscle tone.     Coordination: Coordination normal.     Comments: No ataxia on finger to nose bilaterally. No pronator drift. 5/5 strength throughout. CN 2-12 intact.Equal grip strength. Sensation intact.   Psychiatric:        Behavior: Behavior normal.      ED Treatments / Results  Labs (all labs ordered are listed, but only abnormal results are displayed) Labs Reviewed - No data to display  EKG None  Radiology No results found.  Procedures Procedures (including critical care time)  Medications Ordered in ED Medications  fluorescein ophthalmic strip 1 strip (has no administration in time range)  tetracaine (PONTOCAINE) 0.5 % ophthalmic solution 2 drop (has no administration in time range)  oxyCODONE-acetaminophen  (PERCOCET/ROXICET) 5-325 MG per tablet 2 tablet (has no administration in time range)     Initial Impression / Assessment and Plan / ED Course  I have reviewed the triage vital signs and the nursing notes.  Pertinent labs & imaging results that were available during my care of the patient were reviewed by me and considered in my medical decision making (see chart for details).       Patient splashed bleach in eyes about 1 hour ago.  Complains of bilateral eye pain and blurry vision. Chemosis and  injection on exam.   pH initially is 8.  Patient will be irrigated No evidence of fluorescein uptake or corneal abrasion.  After 2 L of IV fluid per eye by Lilia Pro lens, pH is slightly improved to 7-8.  Patient irrigated with a total of 4 L of saline.  Her pH still is in the 7-8 range.  Discussed with Dr. Posey Pronto on-call for ophthalmology.  States he switched call with his partner Dr. Katy Fitch.  Dr. Katy Fitch states to continue to irrigate the eyes and then check pH again to a control eye.  Recommends erythromycin ointment And would like the patient to call him today to establish a time to be seen. He asks for his cell phone number to be given to patient which was done.   D/w patient. She agrees to call Dr. Katy Fitch today.  Patient will given erythromycin ointment after irrigation completed. She will need her pH checked again before discharge. Care transferred to Dr. Vena Austria at shift change.    CRITICAL CARE Performed by: Ezequiel Essex Total critical care time: 45 minutes Critical care time was exclusive of separately billable procedures and treating other patients. Critical care was necessary to treat or prevent imminent or life-threatening deterioration. Critical care was time spent personally by me on the following activities: development of treatment plan with patient and/or surrogate as well as nursing, discussions with consultants, evaluation of patient's response to treatment, examination of  patient, obtaining history from patient or surrogate, ordering and performing treatments and interventions, ordering and review of laboratory studies, ordering and review of radiographic studies, pulse oximetry and re-evaluation of patient's condition.  Final Clinical Impressions(s) / ED Diagnoses   Final diagnoses:  Chemical exposure of eye    ED Discharge Orders    None       Zannie Locastro, Annie Main, MD 11/01/18 [phone]

## 2018-11-01 NOTE — ED Notes (Signed)
ED Provider at bedside. 

## 2018-11-01 NOTE — ED Notes (Signed)
PT given meal, apple juice. Discharge paperwork and paper scrubs

## 2018-11-01 NOTE — ED Triage Notes (Signed)
Pt reports splashing clorox in eyes bilaterally x 1 hour ago. Pt states she rinsed eyes with warm water.

## 2018-11-01 NOTE — ED Provider Notes (Signed)
Signed out to d/c to home after last eye irrigation done.  Irrigation complete. Patient indicates symptoms improved.   Ph paper testing - ph of each eye is approx 7.   Patient appears stable for d/c.      Lajean Saver, MD 11/01/18 [phone]

## 2018-11-01 NOTE — Discharge Instructions (Addendum)
Use the erythromycin ointment as prescribed.  You should follow-up with the ophthalmologist Dr. Katy Fitch today.  His cell phone is [phone].  Call or text him to establish a time today where you can be seen in his office.  Your blood pressure is high this AM - follow up with primary care doctor in the next couple of weeks.

## 2018-11-07 ENCOUNTER — Ambulatory Visit: Payer: BLUE CROSS/BLUE SHIELD | Admitting: Hematology

## 2018-11-07 ENCOUNTER — Inpatient Hospital Stay: Payer: Commercial Managed Care - PPO

## 2018-11-25 ENCOUNTER — Encounter: Payer: BLUE CROSS/BLUE SHIELD | Admitting: Nurse Practitioner

## 2019-01-19 ENCOUNTER — Encounter: Payer: Commercial Managed Care - PPO | Admitting: Nurse Practitioner

## 2019-01-23 ENCOUNTER — Encounter: Payer: Commercial Managed Care - PPO | Admitting: Nurse Practitioner

## 2019-08-11 ENCOUNTER — Other Ambulatory Visit: Payer: Self-pay | Admitting: Nurse Practitioner

## 2019-08-11 DIAGNOSIS — I10 Essential (primary) hypertension: Secondary | ICD-10-CM

## 2019-08-11 NOTE — Telephone Encounter (Signed)
Left vm for the pt to call back, need to offer a CPE or just virtual visit with Nche for BP med refills.

## 2019-08-12 NOTE — Telephone Encounter (Signed)
Left vm for the pt to call back.  

## 2019-10-05 ENCOUNTER — Telehealth (INDEPENDENT_AMBULATORY_CARE_PROVIDER_SITE_OTHER): Payer: Commercial Managed Care - PPO | Admitting: Family Medicine

## 2019-10-05 ENCOUNTER — Encounter: Payer: Self-pay | Admitting: Family Medicine

## 2019-10-05 VITALS — Temp 99.8°F | Ht 60.0 in | Wt 195.0 lb

## 2019-10-05 DIAGNOSIS — T50Z95A Adverse effect of other vaccines and biological substances, initial encounter: Secondary | ICD-10-CM

## 2019-10-05 NOTE — Progress Notes (Signed)
Established Patient Office Visit  Subjective:  Patient ID: Yesenia Mills, female    DOB: 1971-07-17  Age: 49 y.o. MRN: [id number]  CC:  Chief Complaint  Patient presents with  . Headache    pt states that she had second covid vaccine on 10/01/19 and she have been fatigue with bad headaches x 3 days. Patient not sure what she should do for her symptoms.     HPI Yesenia Mills presents for follow up status post covid vaccine with head ache, malaise and fatigue. Was worried that the vaccine gave her COVID. Symptoms have slowly been improving but she is worried.  Past Medical History:  Diagnosis Date  . Gastritis   . GERD (gastroesophageal reflux disease)   . Hypertension   . Iron deficiency anemia     History reviewed. No pertinent surgical history.  Family History  Problem Relation Age of Onset  . Hypertension Mother   . Kidney disease Father   . Heart disease Maternal Grandmother   . Heart failure Maternal Grandmother   . Stroke Maternal Grandmother   . Heart disease Paternal Grandmother   . Heart failure Paternal Grandmother   . Stroke Paternal Grandmother   . Colon cancer Neg Hx     Social History   Socioeconomic History  . Marital status: Married    Spouse name: Not on file  . Number of children: Not on file  . Years of education: Not on file  . Highest education level: Not on file  Occupational History  . Not on file  Tobacco Use  . Smoking status: Never Smoker  . Smokeless tobacco: Never Used  Substance and Sexual Activity  . Alcohol use: Yes    Alcohol/week: 1.0 standard drinks    Types: 1 Glasses of wine per week    Comment: once in awhile  . Drug use: No  . Sexual activity: Yes    Birth control/protection: None  Other Topics Concern  . Not on file  Social History Narrative  . Not on file   Social Determinants of Health   Financial Resource Strain:   . Difficulty of Paying Living Expenses: Not on file  Food Insecurity:   . Worried About  Charity fundraiser in the Last Year: Not on file  . Ran Out of Food in the Last Year: Not on file  Transportation Needs:   . Lack of Transportation (Medical): Not on file  . Lack of Transportation (Non-Medical): Not on file  Physical Activity:   . Days of Exercise per Week: Not on file  . Minutes of Exercise per Session: Not on file  Stress:   . Feeling of Stress : Not on file  Social Connections:   . Frequency of Communication with Friends and Family: Not on file  . Frequency of Social Gatherings with Friends and Family: Not on file  . Attends Religious Services: Not on file  . Active Member of Clubs or Organizations: Not on file  . Attends Archivist Meetings: Not on file  . Marital Status: Not on file  Intimate Partner Violence:   . Fear of Current or Ex-Partner: Not on file  . Emotionally Abused: Not on file  . Physically Abused: Not on file  . Sexually Abused: Not on file    Outpatient Medications Prior to Visit  Medication Sig Dispense Refill  . amLODipine (NORVASC) 5 MG tablet Take 1 tablet (5 mg total) by mouth at bedtime. 90 tablet 1  . erythromycin ophthalmic  ointment Place a 1/2 inch ribbon of ointment into the lower eyelid 4 times daily for 1 week. (Patient not taking: Reported on 10/05/2019) 1 g 0  . omeprazole (PRILOSEC) 40 MG capsule Take 40 mg by mouth daily.     No facility-administered medications prior to visit.    No Known Allergies  ROS Review of Systems  Constitutional: Positive for fatigue and fever. Negative for diaphoresis and unexpected weight change.  HENT: Negative.   Eyes: Negative for photophobia and visual disturbance.  Respiratory: Negative.   Cardiovascular: Negative.   Gastrointestinal: Negative.   Musculoskeletal: Negative for arthralgias and myalgias.  Skin: Negative for pallor and rash.  Neurological: Positive for headaches.  Psychiatric/Behavioral: Negative.       Objective:    Physical Exam  Constitutional: She is  oriented to person, place, and time. She appears well-developed and well-nourished. No distress.  HENT:  Head: Normocephalic and atraumatic.  Right Ear: External ear normal.  Left Ear: External ear normal.  Eyes: Conjunctivae are normal.  Neck: No JVD present.  Pulmonary/Chest: Effort normal.  Neurological: She is alert and oriented to person, place, and time.  Skin: Skin is warm and dry. She is not diaphoretic.  Psychiatric: She has a normal mood and affect. Her behavior is normal.    Temp 99.8 F (37.7 C) (Oral)   Ht 5' (1.524 m)   Wt 195 lb (88.5 kg)   BMI 38.08 kg/m  Wt Readings from Last 3 Encounters:  10/05/19 195 lb (88.5 kg)  11/01/18 190 lb (86.2 kg)  09/19/18 196 lb (88.9 kg)     Health Maintenance Due  Topic Date Due  . HIV Screening  01/29/1985  . TETANUS/TDAP  01/29/1989  . PAP SMEAR-Modifier  01/30/1991  . INFLUENZA VACCINE  03/21/2019    There are no preventive care reminders to display for this patient.  Lab Results  Component Value Date   TSH 1.65 08/07/2018   Lab Results  Component Value Date   WBC 5.9 09/19/2018   HGB 9.6 (L) 09/19/2018   HCT 31.3 (L) 09/19/2018   MCV 79.2 (L) 09/19/2018   PLT 412 (H) 09/19/2018   Lab Results  Component Value Date   NA 138 08/22/2018   K 3.9 08/22/2018   CO2 27 08/22/2018   GLUCOSE 95 08/22/2018   BUN 11 08/22/2018   CREATININE 0.91 08/22/2018   BILITOT 0.4 08/22/2018   ALKPHOS 84 08/22/2018   AST 12 (L) 08/22/2018   ALT 10 08/22/2018   PROT 8.1 08/22/2018   ALBUMIN 4.1 08/22/2018   CALCIUM 9.2 08/22/2018   ANIONGAP 6 08/22/2018   GFR 88.01 08/07/2018   No results found for: CHOL No results found for: HDL No results found for: LDLCALC No results found for: TRIG No results found for: CHOLHDL No results found for: HGBA1C    Assessment & Plan:   Problem List Items Addressed This Visit    None    Visit Diagnoses    Vaccine reaction, initial encounter    -  Primary      No orders of the  defined types were placed in this encounter.   Follow-up: Return if symptoms worsen or fail to improve.  Recommended reassurance for the patient.  Advised her that her symptoms indicate that her immune system is definitely recognized the vaccine and is ready to fight Covid should she be exposed.  She will continue ibuprofen and Tylenol as needed. Libby Maw, MD   Virtual Visit via Video  Note  I connected with Yesenia Mills on 10/05/19 at 11:30 AM EST by a video enabled telemedicine application and verified that I am speaking with the correct person using two identifiers.  Location: Patient: Home alone. provider:    I discussed the limitations of evaluation and management by telemedicine and the availability of in person appointments. The patient expressed understanding and agreed to proceed.  History of Present Illness:    Observations/Objective:   Assessment and Plan:   Follow Up Instructions:    I discussed the assessment and treatment plan with the patient. The patient was provided an opportunity to ask questions and all were answered. The patient agreed with the plan and demonstrated an understanding of the instructions.   The patient was advised to call back or seek an in-person evaluation if the symptoms worsen or if the condition fails to improve as anticipated.  I provided 10 minutes of non-face-to-face time during this encounter.   Libby Maw, MD

## 2019-10-07 ENCOUNTER — Other Ambulatory Visit: Payer: Self-pay | Admitting: Nurse Practitioner

## 2019-10-07 DIAGNOSIS — I10 Essential (primary) hypertension: Secondary | ICD-10-CM

## 2020-04-04 ENCOUNTER — Other Ambulatory Visit: Payer: Self-pay | Admitting: Nurse Practitioner

## 2020-04-04 DIAGNOSIS — I10 Essential (primary) hypertension: Secondary | ICD-10-CM

## 2020-07-03 ENCOUNTER — Other Ambulatory Visit: Payer: Self-pay | Admitting: Nurse Practitioner

## 2020-07-03 DIAGNOSIS — I10 Essential (primary) hypertension: Secondary | ICD-10-CM

## 2020-07-04 NOTE — Telephone Encounter (Signed)
Last fill 07/03/20/Med pending

## 2020-11-08 ENCOUNTER — Other Ambulatory Visit: Payer: Self-pay

## 2020-11-08 ENCOUNTER — Encounter: Payer: Self-pay | Admitting: Family Medicine

## 2020-11-08 ENCOUNTER — Ambulatory Visit (INDEPENDENT_AMBULATORY_CARE_PROVIDER_SITE_OTHER): Payer: PRIVATE HEALTH INSURANCE | Admitting: Family Medicine

## 2020-11-08 VITALS — BP 160/90 | HR 67 | Temp 97.7°F | Ht 60.0 in | Wt 207.6 lb

## 2020-11-08 DIAGNOSIS — J302 Other seasonal allergic rhinitis: Secondary | ICD-10-CM

## 2020-11-08 DIAGNOSIS — I10 Essential (primary) hypertension: Secondary | ICD-10-CM | POA: Diagnosis not present

## 2020-11-08 DIAGNOSIS — Z8711 Personal history of peptic ulcer disease: Secondary | ICD-10-CM | POA: Diagnosis not present

## 2020-11-08 MED ORDER — OMEPRAZOLE 40 MG PO CPDR: 40.0000 mg | DELAYED_RELEASE_CAPSULE | Freq: Every day | ORAL | 5 refills | Status: DC

## 2020-11-08 MED ORDER — HYDROCHLOROTHIAZIDE 25 MG PO TABS: 25.0000 mg | ORAL_TABLET | Freq: Every day | ORAL | 3 refills | Status: DC

## 2020-11-08 MED ORDER — BENZONATATE 100 MG PO CAPS: 100.0000 mg | ORAL_CAPSULE | Freq: Two times a day (BID) | ORAL | 0 refills | Status: DC | PRN

## 2020-11-08 MED ORDER — FLUTICASONE PROPIONATE 50 MCG/ACT NA SUSP: 2.0000 | Freq: Every day | NASAL | 6 refills | Status: DC

## 2020-11-08 NOTE — Progress Notes (Signed)
[facility] [address] [place] [address] Dept: [phone] Dept Fax: [phone]  Acute Office Visit  Subjective:    Patient ID: Yesenia Mills, female    DOB: [birth year], 50 y.o..   MRN: [id number]  Chief Complaint  Patient presents with  . Acute Visit    C/o having elevated BP 120/110 - 120/90.  Also 3 days ago started having cough, chest pain, SOB.  Negative covid test and has taken Zyrtec. Needs refills on Omeprazole.     History of Present Illness:  Patient is in today for assessment of her blood pressure. Ms. Milles has not been seen in 2 years for follow-up of her blood pressure. She notes that she occasionally will check her pressure when she is having headaches and will find mild elevations (i.e., diastolic > 80). In the past few weeks, she has been finding pressures in the 120/90-110 range. She continues to take her amlodipine daily. She notes as an aside that she occasionally gets swelling around her ankles.  Also, Ms. Casares notes a 3-day history of cough with mild SOB and chest tightness. She has had some associated nasal congestion with clear rhinorrhea. She performed a home COVID-19 test 3 days ago, which was negative. She has not had fever or loss of taste/smell.  Past Medical History: Patient Active Problem List   Diagnosis Date Noted  . Iron deficiency anemia 08/22/2018  . History of gastric ulcer 08/22/2018  . Thrombocytosis 08/22/2018  . Essential hypertension 08/07/2018  . Heart murmur 08/07/2018  . Epigastric pain 07/20/2015  . Constipation 07/20/2015  . Anemia 07/20/2015  . 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 Pharyngitis 03/28/2013   No past surgical history on file.  Family History  Problem Relation Age of Onset  . Hypertension Mother   . Kidney disease Father   . Heart disease Maternal Grandmother   . Heart failure Maternal Grandmother   . Stroke Maternal Grandmother   . Heart disease  Paternal Grandmother   . Heart failure Paternal Grandmother   . Stroke Paternal Grandmother   . Colon cancer Neg Hx    Outpatient Medications Prior to Visit  Medication Sig Dispense Refill  . amLODipine (NORVASC) 5 MG tablet Take 1 tablet (5 mg total) by mouth daily. No additional refills without office visit 90 tablet 0  . omeprazole (PRILOSEC) 40 MG capsule Take 40 mg by mouth daily.    Marland Kitchen erythromycin ophthalmic ointment Place a 1/2 inch ribbon of ointment into the lower eyelid 4 times daily for 1 week. (Patient not taking: No sig reported) 1 g 0   No facility-administered medications prior to visit.   No Known Allergies    Objective:   Today's Vitals   11/08/20 0834  BP: (!) 160/90  Pulse: 67  Temp: 97.7 F (36.5 C)  TempSrc: Temporal  SpO2: 96%  Weight: 207 lb 9.6 oz (94.2 kg)  Height: 5' (1.524 m)   Body mass index is 40.54 kg/m.   General: Well developed, well nourished. No acute distress. HEENT: Normocephalic, non-traumatic. Conjunctiva clear. External ears normal. EAC and TMs normal   bilaterally. Nose  with mildly swollen mucosa and redness with clear rhinorrhea. Mucous membranes   moist. Oropharynx clear. Good dentition. Neck: Supple. No lymphadenopathy. No thyromegaly. Lungs: Clear to auscultation bilaterally. CV: RRR without murmurs or rubs. Pulses 2+ bilaterally. Extremities: No edema. Psych: Alert and oriented. Normal mood and affect.  Health Maintenance Due  Topic Date Due  . Hepatitis  C Screening  Never done  . COVID-19 Vaccine (1) Never done  . HIV Screening  Never done  . TETANUS/TDAP  Never done  . PAP SMEAR-Modifier  Never done  . MAMMOGRAM  Never done  . INFLUENZA VACCINE  03/20/2020     Assessment & Plan:   1. Essential hypertension Ms. Buntin' blood pressure is elevated today.  - hydrochlorothiazide (HYDRODIURIL) 25 MG tablet; Take 1 tablet (25 mg total) by mouth daily.  Dispense: 90 tablet; Refill: 3  2. Seasonal allergic rhinitis,  unspecified trigger I suspect her symptoms are primarily related to seasonal allergies, as she has had these in the past. I will add fluticasone for nasal symptoms and recommend she use Tessalon for cough.   - fluticasone (FLONASE) 50 MCG/ACT nasal spray; Place 2 sprays into both nostrils daily.  Dispense: 16 g; Refill: 6 - benzonatate (TESSALON) 100 MG capsule; Take 1 capsule (100 mg total) by mouth 2 (two) times daily as needed for cough.  Dispense: 20 capsule; Refill: 0  3. History of gastric ulcer I will refill her Prilosec.  - omeprazole (PRILOSEC) 40 MG capsule; Take 1 capsule (40 mg total) by mouth daily.  Dispense: 30 capsule; Refill: 5  Haydee Salter, MD

## 2020-12-12 ENCOUNTER — Encounter: Payer: Self-pay | Admitting: Nurse Practitioner

## 2020-12-12 ENCOUNTER — Other Ambulatory Visit: Payer: Self-pay | Admitting: Family Medicine

## 2020-12-12 ENCOUNTER — Other Ambulatory Visit: Payer: Self-pay | Admitting: Nurse Practitioner

## 2020-12-12 ENCOUNTER — Ambulatory Visit (INDEPENDENT_AMBULATORY_CARE_PROVIDER_SITE_OTHER): Payer: PRIVATE HEALTH INSURANCE | Admitting: Nurse Practitioner

## 2020-12-12 ENCOUNTER — Other Ambulatory Visit: Payer: Self-pay

## 2020-12-12 VITALS — BP 122/70 | HR 74 | Temp 97.9°F | Ht 60.0 in | Wt 206.2 lb

## 2020-12-12 DIAGNOSIS — Z136 Encounter for screening for cardiovascular disorders: Secondary | ICD-10-CM | POA: Diagnosis not present

## 2020-12-12 DIAGNOSIS — D75839 Thrombocytosis, unspecified: Secondary | ICD-10-CM

## 2020-12-12 DIAGNOSIS — E559 Vitamin D deficiency, unspecified: Secondary | ICD-10-CM

## 2020-12-12 DIAGNOSIS — Z13220 Encounter for screening for lipoid disorders: Secondary | ICD-10-CM

## 2020-12-12 DIAGNOSIS — D500 Iron deficiency anemia secondary to blood loss (chronic): Secondary | ICD-10-CM | POA: Diagnosis not present

## 2020-12-12 DIAGNOSIS — I10 Essential (primary) hypertension: Secondary | ICD-10-CM

## 2020-12-12 DIAGNOSIS — N760 Acute vaginitis: Secondary | ICD-10-CM

## 2020-12-12 DIAGNOSIS — Z1231 Encounter for screening mammogram for malignant neoplasm of breast: Secondary | ICD-10-CM

## 2020-12-12 DIAGNOSIS — J302 Other seasonal allergic rhinitis: Secondary | ICD-10-CM

## 2020-12-12 LAB — CBC WITH DIFFERENTIAL/PLATELET
Basophils Absolute: 0 10*3/uL (ref 0.0–0.1)
Basophils Relative: 0.6 % (ref 0.0–3.0)
Eosinophils Absolute: 0.1 10*3/uL (ref 0.0–0.7)
Eosinophils Relative: 1.3 % (ref 0.0–5.0)
HCT: 26.4 % — ABNORMAL LOW (ref 36.0–46.0)
Hemoglobin: 8.2 g/dL — ABNORMAL LOW (ref 12.0–15.0)
Lymphocytes Relative: 27 % (ref 12.0–46.0)
Lymphs Abs: 1.9 10*3/uL (ref 0.7–4.0)
MCHC: 31 g/dL (ref 30.0–36.0)
MCV: 68.6 fl — ABNORMAL LOW (ref 78.0–100.0)
Monocytes Absolute: 0.3 10*3/uL (ref 0.1–1.0)
Monocytes Relative: 4 % (ref 3.0–12.0)
Neutro Abs: 4.8 10*3/uL (ref 1.4–7.7)
Neutrophils Relative %: 67.1 % (ref 43.0–77.0)
Platelets: 405 10*3/uL — ABNORMAL HIGH (ref 150.0–400.0)
RBC: 3.85 Mil/uL — ABNORMAL LOW (ref 3.87–5.11)
RDW: 20.5 % — ABNORMAL HIGH (ref 11.5–15.5)
WBC: 7.2 10*3/uL (ref 4.0–10.5)

## 2020-12-12 LAB — LIPID PANEL
Cholesterol: 156 mg/dL (ref 0–200)
HDL: 39.2 mg/dL (ref >39.00)
LDL Cholesterol: 100 mg/dL — ABNORMAL HIGH (ref 0–99)
NonHDL: 116.71
Total CHOL/HDL Ratio: 4
Triglycerides: 83 mg/dL (ref 0.0–149.0)
VLDL: 16.6 mg/dL (ref 0.0–40.0)

## 2020-12-12 LAB — COMPREHENSIVE METABOLIC PANEL
ALT: 8 U/L (ref 0–35)
AST: 10 U/L (ref 0–37)
Albumin: 3.9 g/dL (ref 3.5–5.2)
Alkaline Phosphatase: 87 U/L (ref 39–117)
BUN: 18 mg/dL (ref 6–23)
CO2: 24 mEq/L (ref 19–32)
Calcium: 9.4 mg/dL (ref 8.4–10.5)
Chloride: 105 mEq/L (ref 96–112)
Creatinine, Ser: 1.1 mg/dL (ref 0.40–1.20)
GFR: 58.44 mL/min — ABNORMAL LOW (ref >60.00)
Glucose, Bld: 96 mg/dL (ref 70–99)
Potassium: 4.1 mEq/L (ref 3.5–5.1)
Sodium: 137 mEq/L (ref 135–145)
Total Bilirubin: 0.4 mg/dL (ref 0.2–1.2)
Total Protein: 7.9 g/dL (ref 6.0–8.3)

## 2020-12-12 LAB — TSH: TSH: 1.35 u[IU]/mL (ref 0.35–4.50)

## 2020-12-12 MED ORDER — AMLODIPINE BESYLATE 5 MG PO TABS: 5.0000 mg | ORAL_TABLET | Freq: Every day | ORAL | 3 refills | Status: DC

## 2020-12-12 NOTE — Progress Notes (Signed)
Subjective:  Patient ID: Yesenia Mills, female    DOB: [birth year]  Age: 50 y.o. MRN: [id number]  CC: Follow-up (4 week f/u on HTN. Pt checks BP at home and states it ranges 140s/80s)  HPI  Essential hypertension Improved BP with amlodipine and HCTZ BP Readings from Last 3 Encounters:  12/12/20 122/70  11/08/20 (!) 160/90  11/01/18 (!) 183/85   Repeat CMP and TSH. Continue current medications  Iron deficiency anemia Reports pica D/c iron supplement due to severe constipation  Last iron infusion 08/2018. Normal upper EGD and colonoscopy 2021 Repeat cbc and iron panel  Thrombocytosis (HCC) Repeat cbc  Wt Readings from Last 3 Encounters:  12/12/20 206 lb 3.2 oz (93.5 kg)  11/08/20 207 lb 9.6 oz (94.2 kg)  10/05/19 195 lb (88.5 kg)   Reviewed past Medical, Social and Family history today.  Outpatient Medications Prior to Visit  Medication Sig Dispense Refill  . benzonatate (TESSALON) 100 MG capsule Take 1 capsule (100 mg total) by mouth 2 (two) times daily as needed for cough. 20 capsule 0  . fluticasone (FLONASE) 50 MCG/ACT nasal spray Place 2 sprays into both nostrils daily. 16 g 6  . hydrochlorothiazide (HYDRODIURIL) 25 MG tablet Take 1 tablet (25 mg total) by mouth daily. 90 tablet 3  . omeprazole (PRILOSEC) 40 MG capsule Take 1 capsule (40 mg total) by mouth daily. 30 capsule 5  . amLODipine (NORVASC) 5 MG tablet Take 1 tablet (5 mg total) by mouth daily. No additional refills without office visit 90 tablet 0   No facility-administered medications prior to visit.    ROS See HPI  Objective:  BP 122/70 (BP Location: Left Arm, Patient Position: Sitting, Cuff Size: Normal)   Pulse 74   Temp 97.9 F (36.6 C) (Temporal)   Ht 5' (1.524 m)   Wt 206 lb 3.2 oz (93.5 kg)   SpO2 97%   BMI 40.27 kg/m   Physical Exam Vitals reviewed.  Constitutional:      Appearance: She is obese.  Cardiovascular:     Rate and Rhythm: Normal rate and regular rhythm.     Pulses:  Normal pulses.     Heart sounds: Murmur heard.    Pulmonary:     Effort: Pulmonary effort is normal.     Breath sounds: Normal breath sounds.  Musculoskeletal:     Cervical back: Normal range of motion and neck supple.     Right lower leg: No edema.     Left lower leg: No edema.  Skin:    General: Skin is warm and dry.  Neurological:     Mental Status: She is alert and oriented to person, place, and time.    Assessment & Plan:  This visit occurred during the SARS-CoV-2 public health emergency.  Safety protocols were in place, including screening questions prior to the visit, additional usage of staff PPE, and extensive cleaning of exam room while observing appropriate contact time as indicated for disinfecting solutions.   [place] was seen today for follow-up.  Diagnoses and all orders for this visit:  Essential hypertension -     TSH -     Comprehensive metabolic panel -     amLODipine (NORVASC) 5 MG tablet; Take 1 tablet (5 mg total) by mouth daily.  Thrombocytosis -     CBC with Differential/Platelet -     Iron, TIBC and Ferritin Panel  Iron deficiency anemia due to chronic blood loss -     CBC with Differential/Platelet -  Iron, TIBC and Ferritin Panel  Vitamin D deficiency -     Vitamin D 1,25 dihydroxy  Encounter for lipid screening for cardiovascular disease -     Lipid panel  Breast cancer screening by mammogram -     MM 3D SCREEN BREAST BILATERAL; Future   Problem List Items Addressed This Visit      Cardiovascular and Mediastinum   Essential hypertension - Primary    Improved BP with amlodipine and HCTZ BP Readings from Last 3 Encounters:  12/12/20 122/70  11/08/20 (!) 160/90  11/01/18 (!) 183/85   Repeat CMP and TSH. Continue current medications      Relevant Medications   amLODipine (NORVASC) 5 MG tablet   Other Relevant Orders   TSH   Comprehensive metabolic panel     Hematopoietic and Hemostatic   Thrombocytosis    Repeat cbc       Relevant Orders   CBC with Differential/Platelet   Iron, TIBC and Ferritin Panel     Other   Iron deficiency anemia    Reports pica D/c iron supplement due to severe constipation  Last iron infusion 08/2018. Normal upper EGD and colonoscopy 2021 Repeat cbc and iron panel      Relevant Orders   CBC with Differential/Platelet   Iron, TIBC and Ferritin Panel    Other Visit Diagnoses    Vitamin D deficiency       Relevant Orders   Vitamin D 1,25 dihydroxy   Encounter for lipid screening for cardiovascular disease       Relevant Orders   Lipid panel   Breast cancer screening by mammogram       Relevant Orders   MM 3D SCREEN BREAST BILATERAL      Follow-up: Return in about 3 months (around 03/13/2021) for HTN and anemia.  Wilfred Lacy, NP

## 2020-12-12 NOTE — Assessment & Plan Note (Signed)
Reports pica D/c iron supplement due to severe constipation  Last iron infusion 08/2018. Normal upper EGD and colonoscopy 2021 Repeat cbc and iron panel

## 2020-12-12 NOTE — Patient Instructions (Addendum)
Go to lab for blood draw Schedule appt with GYN for Pelvic exam You will be contacted to schedule an appt for mammogram. Continue current medication.

## 2020-12-12 NOTE — Assessment & Plan Note (Signed)
Improved BP with amlodipine and HCTZ BP Readings from Last 3 Encounters:  12/12/20 122/70  11/08/20 (!) 160/90  11/01/18 (!) 183/85   Repeat CMP and TSH. Continue current medications

## 2020-12-12 NOTE — Assessment & Plan Note (Signed)
Repeat cbc

## 2020-12-15 LAB — IRON,TIBC AND FERRITIN PANEL
%SAT: 6 % (calc) — ABNORMAL LOW (ref 16–45)
Ferritin: 8 ng/mL — ABNORMAL LOW (ref 16–232)
Iron: 26 ug/dL — ABNORMAL LOW (ref 45–160)
TIBC: 439 mcg/dL (calc) (ref 250–450)

## 2020-12-15 LAB — VITAMIN D 1,25 DIHYDROXY
Vitamin D 1, 25 (OH)2 Total: 69 pg/mL (ref 18–72)
Vitamin D2 1, 25 (OH)2: <8 pg/mL
Vitamin D3 1, 25 (OH)2: 69 pg/mL

## 2020-12-16 ENCOUNTER — Telehealth: Payer: Self-pay | Admitting: *Deleted

## 2020-12-16 MED ORDER — FLUCONAZOLE 200 MG PO TABS: 200.0000 mg | ORAL_TABLET | Freq: Once | ORAL | 0 refills | Status: AC

## 2020-12-16 NOTE — Telephone Encounter (Signed)
Per message from Sheria Lang and ok with Clarise Cruz - called patient and lvm of upcoming appointments

## 2020-12-16 NOTE — Addendum Note (Signed)
Addended by: Leana Gamer on: 12/16/2020 08:35 AM   Modules accepted: Orders

## 2020-12-20 ENCOUNTER — Other Ambulatory Visit: Payer: Self-pay | Admitting: Family

## 2020-12-20 DIAGNOSIS — D649 Anemia, unspecified: Secondary | ICD-10-CM

## 2020-12-21 ENCOUNTER — Other Ambulatory Visit: Payer: Self-pay | Admitting: Family

## 2020-12-21 ENCOUNTER — Inpatient Hospital Stay: Payer: No Typology Code available for payment source | Attending: Family

## 2020-12-21 ENCOUNTER — Inpatient Hospital Stay (HOSPITAL_BASED_OUTPATIENT_CLINIC_OR_DEPARTMENT_OTHER): Payer: No Typology Code available for payment source | Admitting: Family

## 2020-12-21 ENCOUNTER — Inpatient Hospital Stay: Payer: No Typology Code available for payment source

## 2020-12-21 ENCOUNTER — Other Ambulatory Visit: Payer: Self-pay

## 2020-12-21 ENCOUNTER — Encounter: Payer: Self-pay | Admitting: Family

## 2020-12-21 VITALS — BP 143/61 | HR 77

## 2020-12-21 VITALS — BP 143/73 | HR 77 | Ht 60.0 in | Wt 200.0 lb

## 2020-12-21 DIAGNOSIS — Z79899 Other long term (current) drug therapy: Secondary | ICD-10-CM | POA: Diagnosis not present

## 2020-12-21 DIAGNOSIS — D649 Anemia, unspecified: Secondary | ICD-10-CM

## 2020-12-21 DIAGNOSIS — K909 Intestinal malabsorption, unspecified: Secondary | ICD-10-CM | POA: Diagnosis not present

## 2020-12-21 DIAGNOSIS — D500 Iron deficiency anemia secondary to blood loss (chronic): Secondary | ICD-10-CM

## 2020-12-21 DIAGNOSIS — D508 Other iron deficiency anemias: Secondary | ICD-10-CM | POA: Insufficient documentation

## 2020-12-21 DIAGNOSIS — D509 Iron deficiency anemia, unspecified: Secondary | ICD-10-CM

## 2020-12-21 LAB — RETICULOCYTES
Immature Retic Fract: 22.6 % — ABNORMAL HIGH (ref 2.3–15.9)
RBC.: 3.88 MIL/uL (ref 3.87–5.11)
Retic Count, Absolute: 44.2 10*3/uL (ref 19.0–186.0)
Retic Ct Pct: 1.1 % (ref 0.4–3.1)

## 2020-12-21 LAB — CBC WITH DIFFERENTIAL (CANCER CENTER ONLY)
Abs Immature Granulocytes: 0.03 10*3/uL (ref 0.00–0.07)
Basophils Absolute: 0 10*3/uL (ref 0.0–0.1)
Basophils Relative: 1 %
Eosinophils Absolute: 0.1 10*3/uL (ref 0.0–0.5)
Eosinophils Relative: 2 %
HCT: 27 % — ABNORMAL LOW (ref 36.0–46.0)
Hemoglobin: 8.1 g/dL — ABNORMAL LOW (ref 12.0–15.0)
Immature Granulocytes: 1 %
Lymphocytes Relative: 29 %
Lymphs Abs: 1.6 10*3/uL (ref 0.7–4.0)
MCH: 21 pg — ABNORMAL LOW (ref 26.0–34.0)
MCHC: 30 g/dL (ref 30.0–36.0)
MCV: 69.9 fL — ABNORMAL LOW (ref 80.0–100.0)
Monocytes Absolute: 0.4 10*3/uL (ref 0.1–1.0)
Monocytes Relative: 8 %
Neutro Abs: 3.5 10*3/uL (ref 1.7–7.7)
Neutrophils Relative %: 59 %
Platelet Count: 498 10*3/uL — ABNORMAL HIGH (ref 150–400)
RBC: 3.86 MIL/uL — ABNORMAL LOW (ref 3.87–5.11)
RDW: 19.8 % — ABNORMAL HIGH (ref 11.5–15.5)
WBC Count: 5.7 10*3/uL (ref 4.0–10.5)
nRBC: 0 % (ref 0.0–0.2)

## 2020-12-21 LAB — CMP (CANCER CENTER ONLY)
ALT: 10 U/L (ref 0–44)
AST: 12 U/L — ABNORMAL LOW (ref 15–41)
Albumin: 4.1 g/dL (ref 3.5–5.0)
Alkaline Phosphatase: 82 U/L (ref 38–126)
Anion gap: 8 (ref 5–15)
BUN: 16 mg/dL (ref 6–20)
CO2: 26 mmol/L (ref 22–32)
Calcium: 9.9 mg/dL (ref 8.9–10.3)
Chloride: 102 mmol/L (ref 98–111)
Creatinine: 0.98 mg/dL (ref 0.44–1.00)
GFR, Estimated: >60 mL/min (ref >60)
Glucose, Bld: 98 mg/dL (ref 70–99)
Potassium: 4.3 mmol/L (ref 3.5–5.1)
Sodium: 136 mmol/L (ref 135–145)
Total Bilirubin: 0.3 mg/dL (ref 0.3–1.2)
Total Protein: 7.8 g/dL (ref 6.5–8.1)

## 2020-12-21 LAB — SAVE SMEAR(SSMR), FOR PROVIDER SLIDE REVIEW

## 2020-12-21 LAB — LACTATE DEHYDROGENASE: LDH: 117 U/L (ref 98–192)

## 2020-12-21 MED ORDER — SODIUM CHLORIDE 0.9 % IV SOLN
510.0000 mg | Freq: Once | INTRAVENOUS | Status: AC
  Administered 2020-12-21: 510 mg via INTRAVENOUS
  Filled 2020-12-21: qty 17

## 2020-12-21 MED ORDER — SODIUM CHLORIDE 0.9 % IV SOLN
Freq: Once | INTRAVENOUS | Status: AC
Start: 2020-12-21 — End: 2020-12-21
  Filled 2020-12-21: qty 250

## 2020-12-21 NOTE — Progress Notes (Signed)
Hematology and Oncology Follow Up Visit  Revia Nghiem [id number] [birth year] 50 y.o. 12/21/2020   Principle Diagnosis:  Iron deficiency anemia, possibly due to GI blood loss   Current Therapy:   IV iron as indicated   Interim History:  Ms. Groleau is here today to re-establish care for iron deficeincy anemia. She was previously seen by Dr. Maylon Peppers and successfully treated with [facility].  Iron saturation is 6% and ferritin 8. Hgb 8.1, MCV 69 and platelets 498.  She is symptomatic with fatigue, weakness and SOB with exertion. She also states that she had a syncopal episode with at gymnastics last Sunday. Thankfully she was not injured.  She also notes chills and ice cravings.  She currently has an upper respiratory infection with cough. She has seen her PCP and they have her on Flonase which she does not feel is helping.  No fever, n/v, rash, dizziness, chest pain, palpitations, abdominal pain or changes in bowel or bladder habits.  She notes that her ankles are puffy. No pitting edema or redness noted on exam. Pedal pulses are 2+.  She has not had much of an appetite with the respiratory infection but has been making an effort to stay well hydrated. Her weight is stable at 208 lbs.   ECOG Performance Status: 1 - Symptomatic but completely ambulatory  Medications:  Allergies as of 12/21/2020   No Known Allergies     Medication List       Accurate as of Dec 21, 2020 11:31 AM. If you have any questions, ask your nurse or doctor.        amLODipine 5 MG tablet Commonly known as: NORVASC Take 1 tablet (5 mg total) by mouth daily.   benzonatate 100 MG capsule Commonly known as: TESSALON Take 1 capsule (100 mg total) by mouth 2 (two) times daily as needed for cough.   fluticasone 50 MCG/ACT nasal spray Commonly known as: FLONASE Place 2 sprays into both nostrils daily.   hydrochlorothiazide 25 MG tablet Commonly known as: HYDRODIURIL Take 1 tablet (25 mg total) by mouth daily.    omeprazole 40 MG capsule Commonly known as: PRILOSEC Take 1 capsule (40 mg total) by mouth daily.       Allergies: No Known Allergies  Past Medical History, Surgical history, Social history, and Family History were reviewed and updated.  Review of Systems: All other 10 point review of systems is negative.   Physical Exam:  vitals were not taken for this visit.   Wt Readings from Last 3 Encounters:  12/12/20 206 lb 3.2 oz (93.5 kg)  11/08/20 207 lb 9.6 oz (94.2 kg)  10/05/19 195 lb (88.5 kg)    Ocular: Sclerae unicteric, pupils equal, round and reactive to light Ear-nose-throat: Oropharynx clear, dentition fair Lymphatic: No cervical or supraclavicular adenopathy Lungs no rales or rhonchi, good excursion bilaterally Heart regular rate and rhythm, no murmur appreciated Abd soft, nontender, positive bowel sounds MSK no focal spinal tenderness, no joint edema Neuro: non-focal, well-oriented, appropriate affect Breasts: Deferred   Lab Results  Component Value Date   WBC 5.7 12/21/2020   HGB 8.1 (L) 12/21/2020   HCT 27.0 (L) 12/21/2020   MCV 69.9 (L) 12/21/2020   PLT 498 (H) 12/21/2020   Lab Results  Component Value Date   FERRITIN 8 (L) 12/12/2020   IRON 26 (L) 12/12/2020   TIBC 439 12/12/2020   UIBC 208 09/19/2018   IRONPCTSAT 6 (L) 12/12/2020   Lab Results  Component Value Date  RETICCTPCT 1.1 12/21/2020   RBC 3.86 (L) 12/21/2020   RBC 3.88 12/21/2020   No results found for: KPAFRELGTCHN, LAMBDASER, KAPLAMBRATIO No results found for: IGGSERUM, IGA, IGMSERUM No results found for: Odetta Pink, SPEI   Chemistry      Component Value Date/Time   NA 137 12/12/2020 0835   K 4.1 12/12/2020 0835   CL 105 12/12/2020 0835   CO2 24 12/12/2020 0835   BUN 18 12/12/2020 0835   CREATININE 1.10 12/12/2020 0835   CREATININE 0.91 08/22/2018 0835   CREATININE 0.82 07/20/2015 1148      Component Value Date/Time    CALCIUM 9.4 12/12/2020 0835   ALKPHOS 87 12/12/2020 0835   AST 10 12/12/2020 0835   AST 12 (L) 08/22/2018 0835   ALT 8 12/12/2020 0835   ALT 10 08/22/2018 0835   BILITOT 0.4 12/12/2020 0835   BILITOT 0.4 08/22/2018 0835       Impression and Plan: Ms. Pronovost is a very pleasant 50 yo female with 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  She is symptomatic as mentioned above.  We will give her IV iron today and again next week.  Follow-up in 6 weeks.  She was encouraged to contact our office with any questions or concerns.   Laverna Peace, NP 5/4/202211:31 AM

## 2020-12-21 NOTE — Patient Instructions (Signed)

## 2020-12-22 LAB — ERYTHROPOIETIN: Erythropoietin: 120.8 m[IU]/mL — ABNORMAL HIGH (ref 2.6–18.5)

## 2020-12-27 ENCOUNTER — Encounter: Payer: Self-pay | Admitting: Gastroenterology

## 2020-12-27 ENCOUNTER — Other Ambulatory Visit: Payer: Self-pay

## 2020-12-27 ENCOUNTER — Ambulatory Visit (INDEPENDENT_AMBULATORY_CARE_PROVIDER_SITE_OTHER): Payer: PRIVATE HEALTH INSURANCE | Admitting: Gastroenterology

## 2020-12-27 VITALS — BP 140/82 | HR 86 | Ht 60.0 in | Wt 207.4 lb

## 2020-12-27 DIAGNOSIS — Z6841 Body Mass Index (BMI) 40.0 and over, adult: Secondary | ICD-10-CM

## 2020-12-27 DIAGNOSIS — K219 Gastro-esophageal reflux disease without esophagitis: Secondary | ICD-10-CM | POA: Diagnosis not present

## 2020-12-27 DIAGNOSIS — D509 Iron deficiency anemia, unspecified: Secondary | ICD-10-CM | POA: Diagnosis not present

## 2020-12-27 NOTE — Progress Notes (Signed)
P  Chief Complaint:    Iron deficiency anemia  GI History: 50 year old female with a history of IDA, PUD, GERD, gastritis.  1) IDA: History of microcytic anemia dating back to 2012 w/ Hgb 10 and nadir of 7.2 in 2018, has remained ~7 since then.  2020 add ferritin  <4, iron 22, iron saturation 5%, TIBC 443 and treated with IV iron  2) GERD: Index symptoms of heartburn.  Previously treated with Dexilant which controls her symptoms, but changed back to Prilosec for unknown reasons.  Symptoms controlled with Prilosec 40 mg/day  Endoscopic History: - EGD approximately 2016 at OSH reportedly n/f gastric ulcer, treated with sucralfate and PPI - EGD (09/2018, Dr. Bryan Lemma): Nonobstructing Schatzki's ring, otherwise normal.  Duodenal path with peptic duodenitis but no celiac - Colonoscopy (09/2018, Dr. Bryan Lemma): 2 mm rectal hyperplastic polyp, otherwise normal.  Normal TI.  Repeat 10 years  HPI:     Patient is a 50 y.o. female presenting to the Gastroenterology Clinic for follow-up.  Initially seen by me on 09/18/2018 for evaluation of IDA, PUD, GERD and for CRC screening.  Completed EGD/colonoscopy in 09/2018 as outlined above, and no follow-up since then.  Had recommended Video Capsule Endoscopy for small bowel interrogation given history of IDA, but this was never completed by patient.  No labs between 08/2018 and those completed last month as outlined below.  Today, she presents for continued work-up of iron deficiency anemia.  Labs completed 12/12/2020 n/f: -H/H 8.2/26.4, MCV/RDW 60.6/20.5.  PLT 405 - Ferritin 8, iron 26, TIBC 439, sat 6%.  Referral placed by [facility] to Hematology - Normal vitamin D, TSH, CMP  Labs completed 12/21/2020: - H/H 8.1/27, MCV/RDW 69.9/19.8.  PLT 498  Was seen by Laverna Peace in the Hematology office on 12/21/2020 and treated with IV iron for persistent IDA as above.  Repeat IV iron scheduled for tomorrow. Ice pica improved afte last weeks IV iron.   No  hematochezia, melena, SOB, DOE, CP.   Separately, has been having post prandial cough with subsequent n/v. Improved when taking Prilosec as prescribed. No dysphagia. Sxs started when she tried to stop her PPI. Has gained 15# since last appt.   Review of systems:     No chest pain, no SOB, no fevers, no urinary sx   Past Medical History:  Diagnosis Date  . Gastritis   . GERD (gastroesophageal reflux disease)   . Hypertension   . Iron deficiency anemia     Patient's surgical history, family medical history, social history, medications and allergies were all reviewed in Epic    Current Outpatient Medications  Medication Sig Dispense Refill  . amLODipine (NORVASC) 5 MG tablet Take 1 tablet (5 mg total) by mouth daily. 90 tablet 3  . benzonatate (TESSALON) 100 MG capsule Take 1 capsule (100 mg total) by mouth 2 (two) times daily as needed for cough. 20 capsule 0  . fluticasone (FLONASE) 50 MCG/ACT nasal spray Place 2 sprays into both nostrils daily. 16 g 6  . hydrochlorothiazide (HYDRODIURIL) 25 MG tablet Take 1 tablet (25 mg total) by mouth daily. 90 tablet 3  . omeprazole (PRILOSEC) 40 MG capsule Take 1 capsule (40 mg total) by mouth daily. 30 capsule 5   No current facility-administered medications for this visit.    Physical Exam:     Ht 5' (1.524 m)   Wt 207 lb 6.4 oz (94.1 kg)   BMI 40.51 kg/m   GENERAL:  Pleasant female in NAD PSYCH: :  Cooperative, normal affect NEURO: Alert and oriented x 3, no focal neurologic deficits   IMPRESSION and PLAN:    1) Iron deficiency anemia - Video Capsule Endoscopy for small bowel interrogation - IV iron infusion tomorrow as scheduled - Continue follow-up in the Hematology clinic - Did discuss possibility for iron deficiency 2/2 chronic PPI use and addressing further as below  2) GERD - Reflux symptoms well controlled when taking Prilosec - Continue Prilosec as prescribed - Continue antireflux lifestyle/dietary modifications -  Discussed antireflux surgical options as a means to better control her reflux and stop or significantly reduce the need for acid suppression therapy.  Based on current BMI, cannot offer TIF.  She does not want to undergo bariatric surgery due to extensiveness of that operation and likely postop contribution of iron deficiency  3) Obesity - Discussed relationship of obesity and underlying reflux along with antireflux surgical options related to obesity - Referral to [facility] - If able to get BMI <35, and if ongoing reflux symptoms, can potentially offer TIF  I spent 35 minutes of time, including in depth chart review, independent review of results as outlined above, communicating results with the patient directly, face-to-face time with the patient, coordinating care, and ordering studies and medications as appropriate, and documentation.           [place] ,DO, FACG 12/27/2020, 1:20 PM

## 2020-12-27 NOTE — Patient Instructions (Signed)
If you are age 51 or older, your body mass index should be between 23-30. Your Body mass index is 40.51 kg/m. If this is out of the aforementioned range listed, please consider follow up with your Primary Care Provider.  If you are age 30 or younger, your body mass index should be between 19-25. Your Body mass index is 40.51 kg/m. If this is out of the aformentioned range listed, please consider follow up with your Primary Care Provider.   You are schedule to have your VCE on 01/09/2021 @830am .  We are sending a referral to [facility], they will call you with an appointment.  Due to recent changes in healthcare laws, you may see the results of your imaging and laboratory studies on MyChart before your provider has had a chance to review them.  We understand that in some cases there may be results that are confusing or concerning to you. Not all laboratory results come back in the same time frame and the provider may be waiting for multiple results in order to interpret others.  Please give [place] 48 hours in order for your provider to thoroughly review all the results before contacting the office for clarification of your results.   Thank you for choosing me and [facility].  Vito Cirigliano, D.O.

## 2020-12-28 ENCOUNTER — Inpatient Hospital Stay: Payer: No Typology Code available for payment source

## 2020-12-28 ENCOUNTER — Other Ambulatory Visit: Payer: Self-pay

## 2020-12-28 VITALS — BP 136/69 | HR 78 | Temp 98.4°F | Resp 18

## 2020-12-28 DIAGNOSIS — D509 Iron deficiency anemia, unspecified: Secondary | ICD-10-CM

## 2020-12-28 DIAGNOSIS — D508 Other iron deficiency anemias: Secondary | ICD-10-CM | POA: Diagnosis not present

## 2020-12-28 MED ORDER — SODIUM CHLORIDE 0.9 % IV SOLN
Freq: Once | INTRAVENOUS | Status: AC
  Filled 2020-12-28: qty 250

## 2020-12-28 MED ORDER — SODIUM CHLORIDE 0.9 % IV SOLN
510.0000 mg | Freq: Once | INTRAVENOUS | Status: AC
  Administered 2020-12-28: 510 mg via INTRAVENOUS
  Filled 2020-12-28: qty 510

## 2020-12-28 NOTE — Patient Instructions (Signed)
Ferumoxytol injection What is this medicine? FERUMOXYTOL is an iron complex. Iron is used to make healthy red blood cells, which carry oxygen and nutrients throughout the body. This medicine is used to treat iron deficiency anemia. This medicine may be used for other purposes; ask your health care provider or pharmacist if you have questions. COMMON BRAND NAME(S): Feraheme What should I tell my health care provider before I take this medicine? They need to know if you have any of these conditions:  anemia not caused by low iron levels  high levels of iron in the blood  magnetic resonance imaging (MRI) test scheduled  an unusual or allergic reaction to iron, other medicines, foods, dyes, or preservatives  pregnant or trying to get pregnant  breast-feeding How should I use this medicine? This medicine is for injection into a vein. It is given by a health care professional in a hospital or clinic setting. Talk to your pediatrician regarding the use of this medicine in children. Special care may be needed. Overdosage: If you think you have taken too much of this medicine contact a poison control center or emergency room at once. NOTE: This medicine is only for you. Do not share this medicine with others. What if I miss a dose? It is important not to miss your dose. Call your doctor or health care professional if you are unable to keep an appointment. What may interact with this medicine? This medicine may interact with the following medications:  other iron products This list may not describe all possible interactions. Give your health care provider a list of all the medicines, herbs, non-prescription drugs, or dietary supplements you use. Also tell them if you smoke, drink alcohol, or use illegal drugs. Some items may interact with your medicine. What should I watch for while using this medicine? Visit your doctor or healthcare professional regularly. Tell your doctor or healthcare  professional if your symptoms do not start to get better or if they get worse. You may need blood work done while you are taking this medicine. You may need to follow a special diet. Talk to your doctor. Foods that contain iron include: whole grains/cereals, dried fruits, beans, or peas, leafy green vegetables, and organ meats (liver, kidney). What side effects may I notice from receiving this medicine? Side effects that you should report to your doctor or health care professional as soon as possible:  allergic reactions like skin rash, itching or hives, swelling of the face, lips, or tongue  breathing problems  changes in blood pressure  feeling faint or lightheaded, falls  fever or chills  flushing, sweating, or hot feelings  swelling of the ankles or feet Side effects that usually do not require medical attention (report to your doctor or health care professional if they continue or are bothersome):  diarrhea  headache  nausea, vomiting  stomach pain This list may not describe all possible side effects. Call your doctor for medical advice about side effects. You may report side effects to FDA at 1-800-FDA-1088. Where should I keep my medicine? This drug is given in a hospital or clinic and will not be stored at home. NOTE: This sheet is a summary. It may not cover all possible information. If you have questions about this medicine, talk to your doctor, pharmacist, or health care provider.  2021 Elsevier/Gold Standard (2016-09-24 20:21:10)  

## 2020-12-29 ENCOUNTER — Encounter: Payer: Self-pay | Admitting: Gastroenterology

## 2020-12-29 LAB — ALPHA-THALASSEMIA GENOTYPR

## 2021-01-09 ENCOUNTER — Encounter: Payer: Self-pay | Admitting: Gastroenterology

## 2021-01-09 ENCOUNTER — Ambulatory Visit (INDEPENDENT_AMBULATORY_CARE_PROVIDER_SITE_OTHER): Payer: PRIVATE HEALTH INSURANCE | Admitting: Gastroenterology

## 2021-01-09 DIAGNOSIS — D500 Iron deficiency anemia secondary to blood loss (chronic): Secondary | ICD-10-CM | POA: Diagnosis not present

## 2021-01-09 NOTE — Progress Notes (Signed)
Pt here for capsule endo, completed prep without difficulty.Pt swallowed capsule without issues.  Capsule id#[id number]# [id number] Exp:  04/04/22  Pt will return at 4pm to have monitor removed.

## 2021-01-10 ENCOUNTER — Encounter: Payer: Self-pay | Admitting: Family

## 2021-01-12 NOTE — Progress Notes (Signed)
Video capsule endoscopy completed and images reviewed and notable for the following:  Procedure information findings: 1) Complete capsule endoscopy with adequate prep 2) Mild duodenitis.  Otherwise, no active bleeding or stigmata of recent bleeding noted on this study  Recommendations: - Continue daily PPI as prescribed - Continue follow-up in the Hematology clinic

## 2021-01-13 ENCOUNTER — Telehealth: Payer: Self-pay

## 2021-01-13 NOTE — Telephone Encounter (Signed)
LVM for patient to call back. ?

## 2021-01-13 NOTE — Telephone Encounter (Signed)
-----   Message from [place], DO sent at 01/12/2021  4:41 PM EDT ----- Can you please call the patient and update her on the results of her recent video capsule endoscopy completed and images reviewed and notable for the following:  Procedure information findings: 1) Complete capsule endoscopy with adequate prep 2) Mild duodenitis.  Otherwise, no active bleeding or stigmata of recent bleeding noted on this study  Recommendations: - Continue daily PPI as prescribed - Continue follow-up in the Hematology clinic

## 2021-02-01 ENCOUNTER — Ambulatory Visit: Payer: PRIVATE HEALTH INSURANCE | Admitting: Family

## 2021-02-01 ENCOUNTER — Other Ambulatory Visit: Payer: PRIVATE HEALTH INSURANCE

## 2021-02-01 ENCOUNTER — Ambulatory Visit: Payer: PRIVATE HEALTH INSURANCE

## 2021-02-09 ENCOUNTER — Encounter: Payer: Self-pay | Admitting: Family

## 2021-02-13 ENCOUNTER — Ambulatory Visit: Payer: PRIVATE HEALTH INSURANCE

## 2021-02-16 ENCOUNTER — Other Ambulatory Visit: Payer: Self-pay

## 2021-02-16 ENCOUNTER — Encounter: Payer: Self-pay | Admitting: Gastroenterology

## 2021-02-16 ENCOUNTER — Ambulatory Visit (INDEPENDENT_AMBULATORY_CARE_PROVIDER_SITE_OTHER): Payer: No Typology Code available for payment source | Admitting: Gastroenterology

## 2021-02-16 VITALS — BP 130/84 | HR 65 | Ht 60.0 in | Wt 208.5 lb

## 2021-02-16 DIAGNOSIS — K219 Gastro-esophageal reflux disease without esophagitis: Secondary | ICD-10-CM | POA: Diagnosis not present

## 2021-02-16 DIAGNOSIS — Z6841 Body Mass Index (BMI) 40.0 and over, adult: Secondary | ICD-10-CM

## 2021-02-16 DIAGNOSIS — R112 Nausea with vomiting, unspecified: Secondary | ICD-10-CM | POA: Diagnosis not present

## 2021-02-16 DIAGNOSIS — R053 Chronic cough: Secondary | ICD-10-CM

## 2021-02-16 NOTE — Patient Instructions (Addendum)
If you are age 51 or older, your body mass index should be between 23-30. Your Body mass index is 40.72 kg/m. If this is out of the aforementioned range listed, please consider follow up with your Primary Care Provider.  If you are age 35 or younger, your body mass index should be between 19-25. Your Body mass index is 40.72 kg/m. If this is out of the aformentioned range listed, please consider follow up with your Primary Care Provider.  ________________________________________________________  Dennis Bast have been scheduled for a gastric emptying scan at [facility] on           at           . Please arrive at least 15 minutes prior to your appointment for registration. Please make certain not to have anything to eat or drink after midnight the night before your test. Hold all stomach medications (ex: Zofran, phenergan, Reglan) 48 hours prior to your test. If you need to reschedule your appointment, please contact radiology scheduling at [phone]. _____________________________________________________________________ A gastric-emptying study measures how long it takes for food to move through your stomach. There are several ways to measure stomach emptying. In the most common test, you eat food that contains a small amount of radioactive material. A scanner that detects the movement of the radioactive material is placed over your abdomen to monitor the rate at which food leaves your stomach. This test normally takes about 4 hours to complete. __________________________________________________________  1) Please arrive at [facility]  on 03/15/2021 at 9:00. You will register in outpatient registration which is on the first floor of the hospital.  2) You may not have anything to eat or drink after midnight the night before your test.  3) Do not take Prilosec, Prevacid, Nexium, Zegerid, Protonix, Dexilant or Aciphex for 7 days prior to your test.  4) Do not take Reglan, Tagamet,  Zantac, Axid or Pepcid 7 days prior to your test. You may take an antacid if needed up to 12 hours prior to the test to decrease pain or discomfort.  You will be contacted by [place] in the next 2 days to arrange your gastric emptying study.  The number on your caller ID will be [phone], please answer when they call.  If you have not heard from them in 2 days please call [phone] to schedule.    Due to recent changes in healthcare laws, you may see the results of your imaging and laboratory studies on MyChart before your provider has had a chance to review them.  We understand that in some cases there may be results that are confusing or concerning to you. Not all laboratory results come back in the same time frame and the provider may be waiting for multiple results in order to interpret others.  Please give [place] 48 hours in order for your provider to thoroughly review all the results before contacting the office for clarification of your results.    Thank you for choosing me and [facility].  Vito Cirigliano, D.O.

## 2021-02-16 NOTE — Progress Notes (Signed)
Chief Complaint:   GERD, discuss antireflux surgical options  GI History: 50 year old female with a history of IDA, PUD, GERD, gastritis.   1) IDA: History of microcytic anemia dating back to 2012 w/ Hgb 10 and nadir of 7.2 in 2018, has remained ~7 since then.  2020 ferritin  <4, iron 22, iron saturation 5%, TIBC 443 and treated with IV iron - 11/2020: H/H 8.2/26.4, MCV/RDW 60.6/20.5.  Ferritin 8, iron 26, TIBC 439, sat 6%.  Referred to Hematology - 12/2020: Seen in the Hematology clinic.  Treated with IV iron x2 - 12/2020: VCE: Mild duodenitis, otherwise unremarkable   2) GERD: Index symptoms of heartburn.  Previously treated with Dexilant which controls her symptoms, but changed back to Prilosec for unknown reasons.  Symptoms controlled with Prilosec 40 mg/day.  Symptoms recur when stopping PPI. - GERD-HRQL Questionnaire Score: 30/50 (on PPI)   Endoscopic History: - EGD approximately 2016 at OSH reportedly n/f gastric ulcer, treated with sucralfate and PPI - EGD (09/2018, Dr. Bryan Lemma): Nonobstructing Schatzki's ring, otherwise normal.  Duodenal path with peptic duodenitis but no celiac - Colonoscopy (09/2018, Dr. Bryan Lemma): 2 mm rectal hyperplastic polyp, otherwise normal.  Normal TI.  Repeat 10 years - VCE (12/2020): Mild duodenitis, otherwise unremarkable  HPI:     Patient is a 50 y.o. female presenting to the Gastroenterology Clinic for follow-up.  Last seen by me on 12/27/2020.  IDA was being managed by Hematology.  Main issue at that time was postprandial cough with posttussive emesis.  Improved with Prilosec.  Was referred to [facility] for obesity and goal of weight loss to improve reflux or bridge to antireflux (not bariatric) surgery.  Today, she would like to discuss antireflux surgical options.  Reflux currently well controlled with omeprazole 40 mg/day, but breakthrough with any missed dose and would ultimately like to stop medications due to concerns regarding  side effect profile.   Taking Prilosec after breakfast. Will modify that timing today.   Still with post prandial cough then vomiting. Occurs a few mins after finishing meal. Independent of meal time or food type. Can also have episodes of vomiting a few hours later, just less common.  Otherwise does not have lingering nausea through the day; just postprandial.  Has made dietary modifications.  Has not been set up for appointment with [facility] yet.  Current BMI 41.  Scheduled for IV iron infusion #3 next month.    Review of systems:     No chest pain, no SOB, no fevers, no urinary sx   Past Medical History:  Diagnosis Date   Gastritis    GERD (gastroesophageal reflux disease)    Hypertension    Iron deficiency anemia     Patient's surgical history, family medical history, social history, medications and allergies were all reviewed in Epic    Current Outpatient Medications  Medication Sig Dispense Refill   amLODipine (NORVASC) 5 MG tablet Take 1 tablet (5 mg total) by mouth daily. 90 tablet 3   fluticasone (FLONASE) 50 MCG/ACT nasal spray Place 2 sprays into both nostrils daily. (Patient not taking: Reported on 12/27/2020) 16 g 6   hydrochlorothiazide (HYDRODIURIL) 25 MG tablet Take 1 tablet (25 mg total) by mouth daily. 90 tablet 3   omeprazole (PRILOSEC) 40 MG capsule Take 1 capsule (40 mg total) by mouth daily. 30 capsule 5   No current facility-administered medications for this visit.    Physical Exam:     There were no  vitals taken for this visit.  GENERAL:  Pleasant female in NAD PSYCH: : Cooperative, normal affect Musculoskeletal:  Normal muscle tone, normal strength NEURO: Alert and oriented x 3, no focal neurologic deficits   IMPRESSION and PLAN:    1) GERD 2) Postprandial cough 3) Nausea/Vomiting - Patient very interested in antireflux surgical options.  We again discussed that her current BMI prohibits TIF.  She does not want to go for  bariatric/bypass surgery.  Planning on weight loss and reevaluation plan BMI <35 - Referral previously placed to [facility]. - Gastric Emptying Study - Esophageal Manometry and pH/impedance testing off PPI - Continue omeprazole.  Change timing to take 30-60 minutes prior to breakfast each morning - Continue antireflux lifestyle/dietary modifications  4) Obesity - As above, discussed obesity with relationship to reflux - Has referral in place  I spent 30 minutes of time, including in depth chart review, independent review of results as outlined above, communicating results with the patient directly, face-to-face time with the patient, coordinating care, and ordering studies and medications as appropriate, and documentation.           Lavena Bullion ,DO, FACG 02/16/2021, 8:42 AM

## 2021-02-27 ENCOUNTER — Ambulatory Visit (HOSPITAL_COMMUNITY): Payer: No Typology Code available for payment source

## 2021-03-02 ENCOUNTER — Ambulatory Visit: Payer: PRIVATE HEALTH INSURANCE

## 2021-03-02 ENCOUNTER — Other Ambulatory Visit: Payer: PRIVATE HEALTH INSURANCE

## 2021-03-02 ENCOUNTER — Ambulatory Visit: Payer: PRIVATE HEALTH INSURANCE | Admitting: Family

## 2021-03-15 ENCOUNTER — Ambulatory Visit (HOSPITAL_COMMUNITY): Admit: 2021-03-15 | Payer: No Typology Code available for payment source | Admitting: Gastroenterology

## 2021-03-15 ENCOUNTER — Encounter (HOSPITAL_COMMUNITY): Payer: Self-pay

## 2021-03-15 SURGERY — MANOMETRY, ESOPHAGUS

## 2021-03-22 ENCOUNTER — Ambulatory Visit: Payer: PRIVATE HEALTH INSURANCE | Admitting: Nurse Practitioner

## 2021-07-27 ENCOUNTER — Other Ambulatory Visit: Payer: Self-pay | Admitting: Nurse Practitioner

## 2021-07-27 ENCOUNTER — Other Ambulatory Visit: Payer: Self-pay | Admitting: Family Medicine

## 2021-07-27 DIAGNOSIS — Z8711 Personal history of peptic ulcer disease: Secondary | ICD-10-CM

## 2021-07-28 NOTE — Telephone Encounter (Signed)
Left pt msg to sched for march.

## 2021-09-17 ENCOUNTER — Other Ambulatory Visit: Payer: Self-pay | Admitting: Nurse Practitioner

## 2021-09-17 DIAGNOSIS — N760 Acute vaginitis: Secondary | ICD-10-CM

## 2021-09-18 ENCOUNTER — Other Ambulatory Visit: Payer: Self-pay | Admitting: Nurse Practitioner

## 2021-09-18 DIAGNOSIS — N760 Acute vaginitis: Secondary | ICD-10-CM

## 2021-09-19 ENCOUNTER — Other Ambulatory Visit: Payer: Self-pay | Admitting: Nurse Practitioner

## 2021-09-19 DIAGNOSIS — N760 Acute vaginitis: Secondary | ICD-10-CM

## 2021-10-25 ENCOUNTER — Other Ambulatory Visit: Payer: Self-pay | Admitting: Nurse Practitioner

## 2021-10-25 DIAGNOSIS — Z8711 Personal history of peptic ulcer disease: Secondary | ICD-10-CM

## 2021-10-27 ENCOUNTER — Other Ambulatory Visit: Payer: Self-pay | Admitting: Nurse Practitioner

## 2021-10-27 DIAGNOSIS — Z8711 Personal history of peptic ulcer disease: Secondary | ICD-10-CM

## 2021-10-30 NOTE — Telephone Encounter (Signed)
Rx filled by PCP on 10/27/21 ?

## 2021-11-23 ENCOUNTER — Ambulatory Visit: Payer: No Typology Code available for payment source | Admitting: Nurse Practitioner

## 2021-11-29 ENCOUNTER — Other Ambulatory Visit: Payer: Self-pay | Admitting: Nurse Practitioner

## 2021-11-29 DIAGNOSIS — Z8711 Personal history of peptic ulcer disease: Secondary | ICD-10-CM

## 2021-11-29 NOTE — Telephone Encounter (Signed)
Chart supports Rx  ?Last seen 10/2021 ?Next OV 12/11/2021 ?

## 2021-11-29 NOTE — Telephone Encounter (Signed)
Duplicate

## 2021-12-11 ENCOUNTER — Ambulatory Visit: Payer: No Typology Code available for payment source | Admitting: Nurse Practitioner

## 2021-12-24 ENCOUNTER — Other Ambulatory Visit: Payer: Self-pay | Admitting: Nurse Practitioner

## 2021-12-24 ENCOUNTER — Other Ambulatory Visit: Payer: Self-pay | Admitting: Family Medicine

## 2021-12-24 DIAGNOSIS — I10 Essential (primary) hypertension: Secondary | ICD-10-CM

## 2021-12-25 ENCOUNTER — Other Ambulatory Visit: Payer: Self-pay | Admitting: Nurse Practitioner

## 2021-12-25 DIAGNOSIS — I10 Essential (primary) hypertension: Secondary | ICD-10-CM

## 2021-12-25 NOTE — Telephone Encounter (Signed)
Pt no longer under our care and has not been seen in over a year.  ?

## 2021-12-25 NOTE — Telephone Encounter (Signed)
Chart supports rx refill ?Last ov: 12/12/2020 ?Last refill: 09/27/21 ?

## 2022-02-05 ENCOUNTER — Other Ambulatory Visit: Payer: Self-pay | Admitting: Nurse Practitioner

## 2022-02-05 DIAGNOSIS — Z8711 Personal history of peptic ulcer disease: Secondary | ICD-10-CM

## 2022-02-06 ENCOUNTER — Other Ambulatory Visit: Payer: Self-pay | Admitting: Nurse Practitioner

## 2022-02-06 DIAGNOSIS — Z8711 Personal history of peptic ulcer disease: Secondary | ICD-10-CM

## 2022-02-06 NOTE — Telephone Encounter (Signed)
Needs appt, hasnt been seen in over a year

## 2022-03-03 LAB — GLUCOSE, POCT (MANUAL RESULT ENTRY): POC Glucose: 131 mg/dl — AB (ref 70–99)

## 2022-03-10 ENCOUNTER — Other Ambulatory Visit: Payer: Self-pay

## 2022-03-10 ENCOUNTER — Emergency Department (HOSPITAL_BASED_OUTPATIENT_CLINIC_OR_DEPARTMENT_OTHER): Payer: No Typology Code available for payment source

## 2022-03-10 ENCOUNTER — Emergency Department (HOSPITAL_BASED_OUTPATIENT_CLINIC_OR_DEPARTMENT_OTHER)
Admission: EM | Admit: 2022-03-10 | Discharge: 2022-03-10 | Disposition: A | Discharge status: Home / Self Care | Payer: No Typology Code available for payment source | Attending: Emergency Medicine | Admitting: Emergency Medicine

## 2022-03-10 ENCOUNTER — Encounter (HOSPITAL_BASED_OUTPATIENT_CLINIC_OR_DEPARTMENT_OTHER): Payer: Self-pay | Admitting: Emergency Medicine

## 2022-03-10 DIAGNOSIS — R7989 Other specified abnormal findings of blood chemistry: Secondary | ICD-10-CM | POA: Diagnosis not present

## 2022-03-10 DIAGNOSIS — R519 Headache, unspecified: Secondary | ICD-10-CM | POA: Diagnosis present

## 2022-03-10 DIAGNOSIS — M79662 Pain in left lower leg: Secondary | ICD-10-CM | POA: Diagnosis not present

## 2022-03-10 DIAGNOSIS — M79661 Pain in right lower leg: Secondary | ICD-10-CM | POA: Diagnosis not present

## 2022-03-10 DIAGNOSIS — R7309 Other abnormal glucose: Secondary | ICD-10-CM | POA: Insufficient documentation

## 2022-03-10 DIAGNOSIS — R55 Syncope and collapse: Secondary | ICD-10-CM | POA: Diagnosis not present

## 2022-03-10 DIAGNOSIS — Z79899 Other long term (current) drug therapy: Secondary | ICD-10-CM | POA: Diagnosis not present

## 2022-03-10 DIAGNOSIS — R42 Dizziness and giddiness: Secondary | ICD-10-CM | POA: Insufficient documentation

## 2022-03-10 LAB — URINALYSIS, ROUTINE W REFLEX MICROSCOPIC
Bilirubin Urine: NEGATIVE
Glucose, UA: NEGATIVE mg/dL
Hgb urine dipstick: NEGATIVE
Ketones, ur: NEGATIVE mg/dL
Leukocytes,Ua: NEGATIVE
Nitrite: NEGATIVE
Protein, ur: 30 mg/dL — AB
Specific Gravity, Urine: 1.025 (ref 1.005–1.030)
pH: 5.5 (ref 5.0–8.0)

## 2022-03-10 LAB — CBC
HCT: 34.7 % — ABNORMAL LOW (ref 36.0–46.0)
Hemoglobin: 11 g/dL — ABNORMAL LOW (ref 12.0–15.0)
MCH: 25 pg — ABNORMAL LOW (ref 26.0–34.0)
MCHC: 31.7 g/dL (ref 30.0–36.0)
MCV: 78.9 fL — ABNORMAL LOW (ref 80.0–100.0)
Platelets: 390 10*3/uL (ref 150–400)
RBC: 4.4 MIL/uL (ref 3.87–5.11)
RDW: 15.7 % — ABNORMAL HIGH (ref 11.5–15.5)
WBC: 6.9 10*3/uL (ref 4.0–10.5)
nRBC: 0 % (ref 0.0–0.2)

## 2022-03-10 LAB — BASIC METABOLIC PANEL
Anion gap: 8 (ref 5–15)
BUN: 12 mg/dL (ref 6–20)
CO2: 24 mmol/L (ref 22–32)
Calcium: 9.9 mg/dL (ref 8.9–10.3)
Chloride: 103 mmol/L (ref 98–111)
Creatinine, Ser: 1.07 mg/dL — ABNORMAL HIGH (ref 0.44–1.00)
GFR, Estimated: >60 mL/min (ref >60)
Glucose, Bld: 94 mg/dL (ref 70–99)
Potassium: 3.6 mmol/L (ref 3.5–5.1)
Sodium: 135 mmol/L (ref 135–145)

## 2022-03-10 LAB — CBG MONITORING, ED: Glucose-Capillary: 68 mg/dL — ABNORMAL LOW (ref 70–99)

## 2022-03-10 LAB — PREGNANCY, URINE: Preg Test, Ur: NEGATIVE

## 2022-03-10 LAB — URINALYSIS, MICROSCOPIC (REFLEX): RBC / HPF: NONE SEEN RBC/hpf (ref 0–5)

## 2022-03-10 MED ORDER — DIPHENHYDRAMINE HCL 50 MG/ML IJ SOLN
25.0000 mg | Freq: Once | INTRAMUSCULAR | Status: AC
  Administered 2022-03-10: 25 mg via INTRAVENOUS
  Filled 2022-03-10: qty 1

## 2022-03-10 MED ORDER — ACETAMINOPHEN 500 MG PO TABS
1000.0000 mg | ORAL_TABLET | Freq: Once | ORAL | Status: AC
  Administered 2022-03-10: 1000 mg via ORAL
  Filled 2022-03-10: qty 2

## 2022-03-10 MED ORDER — KETOROLAC TROMETHAMINE 15 MG/ML IJ SOLN
15.0000 mg | Freq: Once | INTRAMUSCULAR | Status: AC
Start: 2022-03-10 — End: 2022-03-10
  Administered 2022-03-10: 15 mg via INTRAVENOUS
  Filled 2022-03-10: qty 1

## 2022-03-10 MED ORDER — ONDANSETRON HCL 4 MG/2ML IJ SOLN
4.0000 mg | Freq: Once | INTRAMUSCULAR | Status: AC
  Administered 2022-03-10: 4 mg via INTRAVENOUS
  Filled 2022-03-10: qty 2

## 2022-03-10 MED ORDER — SODIUM CHLORIDE 0.9 % IV BOLUS
1000.0000 mL | Freq: Once | INTRAVENOUS | Status: AC
  Administered 2022-03-10: 1000 mL via INTRAVENOUS

## 2022-03-10 NOTE — Discharge Instructions (Signed)
It was a pleasure taking care of you today!   Your labs and CT scan today were unremarkable.  You may take over-the-counter 6 oh milligrams ibuprofen or Tylenol and Tylenol every 6 hours as needed for pain.  Ensure to maintain fluid intake with water, tea, broth, soup, Pedialyte, Gatorade.  Cardiopulmonary care provider to set up a follow-up appoint regarding today's ED visit.  Return to the emergency department experience increasing/worsening headache, vision changes, worsening symptoms.

## 2022-03-10 NOTE — ED Provider Notes (Signed)
[facility] EMERGENCY DEPARTMENT Provider Note   CSN: [id number] Arrival date & time: 03/10/22  1345    History  Chief Complaint  Patient presents with   Headache   Dizziness    Yesenia Mills is a 51 y.o. female who presents to the emergency department with concerns for frontal headache that has been waxing and waning onset 3 weeks.  Notes that she had a near syncopal episode around 3 AM this morning.  Patient notes that her symptoms began with a cramp to her lower extremities, and she attempted to ambulate to the bathroom when her vision went black and she believes that she passed out.  Patient notes that she was diaphoretic at the moment.  She was able to return to bed.  Patient went to work today and noted that she did not feel like herself which prompted her ED visit today.  No meds tried prior to arrival.  Denies chest pain, shortness of breath, blurred vision, double vision, vomiting.  The history is provided by the patient. No language interpreter was used.       Home Medications Prior to Admission medications   Medication Sig Start Date End Date Taking? Authorizing Provider  amLODipine (NORVASC) 5 MG tablet TAKE 1 TABLET(5 MG) BY MOUTH DAILY 12/25/21   Nche, Charlene Brooke, NP  hydrochlorothiazide (HYDRODIURIL) 25 MG tablet Take 1 tablet (25 mg total) by mouth daily. Needs appointment for further refills. 12/25/21   Nche, Charlene Brooke, NP  omeprazole (PRILOSEC) 40 MG capsule TAKE 1 CAPSULE(40 MG) BY MOUTH DAILY 11/29/21   Nche, Charlene Brooke, NP      Allergies    Patient has no known allergies.    Review of Systems   Review of Systems  Eyes:  Negative for visual disturbance.  Respiratory:  Negative for shortness of breath.   Cardiovascular:  Negative for chest pain.  Gastrointestinal:  Positive for nausea. Negative for vomiting.  Neurological:  Positive for dizziness and headaches.  All other systems reviewed and are negative.   Physical Exam Updated Vital  Signs BP (!) 179/85 (BP Location: Left Arm)   Pulse 71   Temp 98.8 F (37.1 C) (Oral)   Resp 16   Ht 5' (1.524 m)   Wt 90.7 kg   LMP 02/20/2022   SpO2 98%   BMI 39.06 kg/m  Physical Exam Vitals and nursing note reviewed.  Constitutional:      General: She is not in acute distress.    Appearance: She is not diaphoretic.  HENT:     Head: Normocephalic and atraumatic.     Mouth/Throat:     Pharynx: No oropharyngeal exudate.  Eyes:     General: No scleral icterus.    Conjunctiva/sclera: Conjunctivae normal.  Cardiovascular:     Rate and Rhythm: Normal rate and regular rhythm.     Pulses: Normal pulses.     Heart sounds: Normal heart sounds.  Pulmonary:     Effort: Pulmonary effort is normal. No respiratory distress.     Breath sounds: Normal breath sounds. No wheezing.  Abdominal:     General: Bowel sounds are normal.     Palpations: Abdomen is soft. There is no mass.     Tenderness: There is no abdominal tenderness. There is no guarding or rebound.  Musculoskeletal:        General: Normal range of motion.     Cervical back: Normal range of motion and neck supple.     Comments: Able to ambulate  without assistance or difficulty.  Skin:    General: Skin is warm and dry.  Neurological:     General: No focal deficit present.     Mental Status: She is alert.     Cranial Nerves: Cranial nerves 2-12 are intact.     Sensory: Sensation is intact. No sensory deficit.     Motor: Motor function is intact. No pronator drift.     Comments: Negative pronator drift.  Strength and sensation intact to bilateral upper and lower extremities.  Grip strength 5/5 bilaterally.  Psychiatric:        Behavior: Behavior normal.     ED Results / Procedures / Treatments   Labs (all labs ordered are listed, but only abnormal results are displayed) Labs Reviewed  BASIC METABOLIC PANEL - Abnormal; Notable for the following components:      Result Value   Creatinine, Ser 1.07 (*)    All other  components within normal limits  CBC - Abnormal; Notable for the following components:   Hemoglobin 11.0 (*)    HCT 34.7 (*)    MCV 78.9 (*)    MCH 25.0 (*)    RDW 15.7 (*)    All other components within normal limits  URINALYSIS, ROUTINE W REFLEX MICROSCOPIC - Abnormal; Notable for the following components:   Protein, ur 30 (*)    All other components within normal limits  URINALYSIS, MICROSCOPIC (REFLEX) - Abnormal; Notable for the following components:   Bacteria, UA MANY (*)    All other components within normal limits  CBG MONITORING, ED - Abnormal; Notable for the following components:   Glucose-Capillary 68 (*)    All other components within normal limits  PREGNANCY, URINE    EKG None  Radiology CT HEAD WO CONTRAST  Result Date: 03/10/2022 CLINICAL DATA:  Near syncopal episode early this morning. EXAM: CT HEAD WITHOUT CONTRAST TECHNIQUE: Contiguous axial images were obtained from the base of the skull through the vertex without intravenous contrast. RADIATION DOSE REDUCTION: This exam was performed according to the departmental dose-optimization program which includes automated exposure control, adjustment of the mA and/or kV according to patient size and/or use of iterative reconstruction technique. COMPARISON:  03/12/2017 FINDINGS: Brain: No evidence of acute infarction, hemorrhage, hydrocephalus, extra-axial collection or mass lesion/mass effect. Vascular: No hyperdense vessel or unexpected calcification. Skull: Normal. Negative for fracture or focal lesion. Sinuses/Orbits: Globes and orbits are unremarkable. Visualized sinuses are clear. Other: None. IMPRESSION: Normal unenhanced CT scan of the brain. Electronically Signed   By: Lajean Manes M.D.   On: 03/10/2022 15:30    Procedures Procedures    Medications Ordered in ED Medications  sodium chloride 0.9 % bolus 1,000 mL (0 mLs Intravenous Stopped 03/10/22 1658)  ondansetron (ZOFRAN) injection 4 mg (4 mg Intravenous  Given 03/10/22 1533)  acetaminophen (TYLENOL) tablet 1,000 mg (1,000 mg Oral Given 03/10/22 1533)  ketorolac (TORADOL) 15 MG/ML injection 15 mg (15 mg Intravenous Given 03/10/22 1702)  diphenhydrAMINE (BENADRYL) injection 25 mg (25 mg Intravenous Given 03/10/22 1702)    ED Course/ Medical Decision Making/ A&P Clinical Course as of 03/10/22 1833  Sat Mar 10, 2022  1434 Offered patient medicine for nausea, patient declined at this time noted that she does not like to take a lot of medications. [SB]  9528 Re-evaluated and discussed with patient and family regarding lab and imaging findings. Discussed with patient that it is reassuring that patient was able to ambulate in the ED without assistance or difficulty.  [  SB]  1751 Pt re-evaluated and noted symptoms improved with treatment regimen in the emergency department.  Discussed discharge treatment plan with patient at bedside.  Patient appears safe for discharge at this time. [SB]    Clinical Course User Index [SB] Pyper Olexa A, PA-C                           Medical Decision Making Amount and/or Complexity of Data Reviewed Labs: ordered. Radiology: ordered.  Risk OTC drugs. Prescription drug management.   Pt presented to the ED with waxing and waning frontal headache onset 3 weeks.  Denies vision changes.  Also notes she had a near syncopal episode around 3 AM this morning.  No meds tried prior to arrival.  Denies chest pain, shortness of breath, vision changes, vomiting.  Patient afebrile, not tachycardic or hypoxic.  On exam patient without focal neurological deficits on exam.  Able to ambulate without assistance or difficulty.  Negative pronator drift.  No acute cardiovascular respiratory exam findings.  Differential diagnosis includes SAH, ICH, migraine, dehydration, anemia, arrhythmia, electrolyte abnormality, hypoglycemia.    Labs:  I ordered, and personally interpreted labs.  The pertinent results include:   CBC with hemoglobin at  11 (however improved from previous value) otherwise unremarkable. BMP with slightly elevated creatinine at 1.07 otherwise unremarkable. CBG at 68. Urinalysis without concerns for nitrate or leukocytes. Urine pregnancy negative  Imaging: I ordered imaging studies including CT head I independently visualized and interpreted imaging which showed: No acute intracranial abnormality I agree with the radiologist interpretation  Medications:  I ordered medication including IV fluids, Benadryl, Toradol, Tylenol, Zofran for symptom management  Reevaluation of the patient after these medicines and interventions, I reevaluated the patient and found that they have improved I have reviewed the patients home medicines and have made adjustments as needed    Disposition: Presentation suspicious for migraine headache and dehydration. Presentation less likely due to [facility] or ICH due to the absence of red flags.  Doubt anemia, hypoglycemia (glucose on BMP at 98), electrolyte abnormality, arrhythmia. After consideration of the diagnostic results and the patients response to treatment, I feel that the patient would benefit from Discharge home. Discussed with patient they may follow-up with their primary care provider as needed.  Supportive care measures and strict return precautions discussed with patient.  Patient knowledges and verbalized understanding.  Patient agreeable to discharge treatment plan. Patient appears safe for discharge at this time.  Follow-up as indicated in the discharge paperwork.   This chart was dictated using voice recognition software, Dragon. Despite the best efforts of this provider to proofread and correct errors, errors may still occur which can change documentation meaning.  Final Clinical Impression(s) / ED Diagnoses Final diagnoses:  Acute nonintractable headache, unspecified headache type    Rx / DC Orders ED Discharge Orders     None         Karyna Bessler A,  PA-C 03/10/22 1833    Lucrezia Starch, MD 03/10/22 2030

## 2022-03-10 NOTE — ED Triage Notes (Signed)
Pt reports HA x 3 wks; reports near syncopal episode around 0300; dizziness started at this time as well; she was got ready for work today, but sts she just doesn't feel right

## 2022-04-10 ENCOUNTER — Other Ambulatory Visit: Payer: Self-pay | Admitting: Nurse Practitioner

## 2022-04-10 DIAGNOSIS — Z8711 Personal history of peptic ulcer disease: Secondary | ICD-10-CM

## 2022-07-20 ENCOUNTER — Telehealth: Payer: Self-pay | Admitting: *Deleted

## 2022-07-20 NOTE — Telephone Encounter (Signed)
Message received from patient requesting an appt for an iron infusion.  Pt last seen in 12/2020.  Message sent to scheduling to schedule pt for labs and to see Sarah NP.

## 2022-07-25 ENCOUNTER — Inpatient Hospital Stay (HOSPITAL_BASED_OUTPATIENT_CLINIC_OR_DEPARTMENT_OTHER): Payer: No Typology Code available for payment source | Admitting: Family

## 2022-07-25 ENCOUNTER — Other Ambulatory Visit: Payer: Self-pay

## 2022-07-25 ENCOUNTER — Inpatient Hospital Stay: Payer: No Typology Code available for payment source | Attending: Hematology & Oncology | Admitting: Family

## 2022-07-25 ENCOUNTER — Inpatient Hospital Stay: Payer: No Typology Code available for payment source

## 2022-07-25 VITALS — BP 177/75 | HR 64 | Temp 98.2°F | Resp 19 | Ht 60.0 in | Wt 204.1 lb

## 2022-07-25 VITALS — BP 155/84 | HR 67 | Resp 16

## 2022-07-25 DIAGNOSIS — D509 Iron deficiency anemia, unspecified: Secondary | ICD-10-CM

## 2022-07-25 DIAGNOSIS — K909 Intestinal malabsorption, unspecified: Secondary | ICD-10-CM | POA: Diagnosis present

## 2022-07-25 DIAGNOSIS — D508 Other iron deficiency anemias: Secondary | ICD-10-CM | POA: Diagnosis present

## 2022-07-25 LAB — CMP (CANCER CENTER ONLY)
ALT: 8 U/L (ref 0–44)
AST: 11 U/L — ABNORMAL LOW (ref 15–41)
Albumin: 4.1 g/dL (ref 3.5–5.0)
Alkaline Phosphatase: 70 U/L (ref 38–126)
Anion gap: 6 (ref 5–15)
BUN: 9 mg/dL (ref 6–20)
CO2: 28 mmol/L (ref 22–32)
Calcium: 9.4 mg/dL (ref 8.9–10.3)
Chloride: 106 mmol/L (ref 98–111)
Creatinine: 0.91 mg/dL (ref 0.44–1.00)
GFR, Estimated: >60 mL/min (ref >60)
Glucose, Bld: 97 mg/dL (ref 70–99)
Potassium: 4.3 mmol/L (ref 3.5–5.1)
Sodium: 140 mmol/L (ref 135–145)
Total Bilirubin: 0.5 mg/dL (ref 0.3–1.2)
Total Protein: 7.7 g/dL (ref 6.5–8.1)

## 2022-07-25 LAB — CBC WITH DIFFERENTIAL (CANCER CENTER ONLY)
Abs Immature Granulocytes: 0.02 10*3/uL (ref 0.00–0.07)
Basophils Absolute: 0 10*3/uL (ref 0.0–0.1)
Basophils Relative: 0 %
Eosinophils Absolute: 0.1 10*3/uL (ref 0.0–0.5)
Eosinophils Relative: 2 %
HCT: 27.2 % — ABNORMAL LOW (ref 36.0–46.0)
Hemoglobin: 8 g/dL — ABNORMAL LOW (ref 12.0–15.0)
Immature Granulocytes: 0 %
Lymphocytes Relative: 31 %
Lymphs Abs: 2 10*3/uL (ref 0.7–4.0)
MCH: 22.3 pg — ABNORMAL LOW (ref 26.0–34.0)
MCHC: 29.4 g/dL — ABNORMAL LOW (ref 30.0–36.0)
MCV: 75.8 fL — ABNORMAL LOW (ref 80.0–100.0)
Monocytes Absolute: 0.4 10*3/uL (ref 0.1–1.0)
Monocytes Relative: 6 %
Neutro Abs: 3.9 10*3/uL (ref 1.7–7.7)
Neutrophils Relative %: 61 %
Platelet Count: 358 10*3/uL (ref 150–400)
RBC: 3.59 MIL/uL — ABNORMAL LOW (ref 3.87–5.11)
RDW: 16.7 % — ABNORMAL HIGH (ref 11.5–15.5)
WBC Count: 6.4 10*3/uL (ref 4.0–10.5)
nRBC: 0 % (ref 0.0–0.2)

## 2022-07-25 LAB — IRON AND IRON BINDING CAPACITY (CC-WL,HP ONLY)
Iron: 9 ug/dL — ABNORMAL LOW (ref 28–170)
Saturation Ratios: 2 % — ABNORMAL LOW (ref 10.4–31.8)
TIBC: 447 ug/dL (ref 250–450)
UIBC: 438 ug/dL (ref 148–442)

## 2022-07-25 LAB — FERRITIN: Ferritin: 4 ng/mL — ABNORMAL LOW (ref 11–307)

## 2022-07-25 MED ORDER — SODIUM CHLORIDE 0.9 % IV SOLN: Freq: Once | INTRAVENOUS | Status: AC

## 2022-07-25 MED ORDER — SODIUM CHLORIDE 0.9 % IV SOLN
510.0000 mg | Freq: Once | INTRAVENOUS | Status: AC
  Administered 2022-07-25: 510 mg via INTRAVENOUS
  Filled 2022-07-25: qty 17

## 2022-07-25 NOTE — Patient Instructions (Signed)

## 2022-07-25 NOTE — Progress Notes (Signed)
Hematology and Oncology Follow Up Visit  Yesenia Mills [id number] 26-Mar-1971 51 y.o. 07/25/2022   Principle Diagnosis:  Iron deficiency anemia, possibly due to GI blood loss    Current Therapy:        IV iron as indicated   Interim History:  Yesenia Mills is here today for follow-up. She is symptomatic with fatigue and chewing lots of ice. She states that she has seen GI and takes Prilosec for GERD. She states that they feel this is blocking her absorption of iron and contributing towards her anemia.  No blood loss noted. No abnormal bruising, no petechiae.  No fever, chills, n/v, cough, rash, dizziness, SOB, chest pain, palpitations, abdominal pain or changes in bowel or bladder habits. No swelling, tenderness, numbness or tingling in her extremities.  No falls or syncope reported.  Appetite and hydration are good. Weight is stable   ECOG Performance Status: 1 - Symptomatic but completely ambulatory  Medications:  Allergies as of 07/25/2022   No Known Allergies      Medication List        Accurate as of July 25, 2022  8:50 AM. If you have any questions, ask your nurse or doctor.          amLODipine 5 MG tablet Commonly known as: NORVASC TAKE 1 TABLET(5 MG) BY MOUTH DAILY   hydrochlorothiazide 25 MG tablet Commonly known as: HYDRODIURIL Take 1 tablet (25 mg total) by mouth daily. Needs appointment for further refills.   omeprazole 40 MG capsule Commonly known as: PRILOSEC TAKE 1 CAPSULE(40 MG) BY MOUTH DAILY        Allergies: No Known Allergies  Past Medical History, Surgical history, Social history, and Family History were reviewed and updated.  Review of Systems: All other 10 point review of systems is negative.   Physical Exam:  vitals were not taken for this visit.   Wt Readings from Last 3 Encounters:  03/10/22 200 lb (90.7 kg)  02/16/21 208 lb 8 oz (94.6 kg)  12/27/20 207 lb 6.4 oz (94.1 kg)    Ocular: Sclerae unicteric, pupils equal,  round and reactive to light Ear-nose-throat: Oropharynx clear, dentition fair Lymphatic: No cervical or supraclavicular adenopathy Lungs no rales or rhonchi, good excursion bilaterally Heart regular rate and rhythm, no murmur appreciated Abd soft, nontender, positive bowel sounds MSK no focal spinal tenderness, no joint edema Neuro: non-focal, well-oriented, appropriate affect Breasts: Deferred   Lab Results  Component Value Date   WBC 6.4 07/25/2022   HGB 8.0 (L) 07/25/2022   HCT 27.2 (L) 07/25/2022   MCV 75.8 (L) 07/25/2022   PLT 358 07/25/2022   Lab Results  Component Value Date   FERRITIN 8 (L) 12/12/2020   IRON 26 (L) 12/12/2020   TIBC 439 12/12/2020   UIBC 208 09/19/2018   IRONPCTSAT 6 (L) 12/12/2020   Lab Results  Component Value Date   RETICCTPCT 1.1 12/21/2020   RBC 3.59 (L) 07/25/2022   No results found for: "KPAFRELGTCHN", "LAMBDASER", "KAPLAMBRATIO" No results found for: "IGGSERUM", "IGA", "IGMSERUM" No results found for: "TOTALPROTELP", "ALBUMINELP", "A1GS", "A2GS", "BETS", "BETA2SER", "GAMS", "MSPIKE", "SPEI"   Chemistry      Component Value Date/Time   NA 135 03/10/2022 1451   K 3.6 03/10/2022 1451   CL 103 03/10/2022 1451   CO2 24 03/10/2022 1451   BUN 12 03/10/2022 1451   CREATININE 1.07 (H) 03/10/2022 1451   CREATININE 0.98 12/21/2020 1041   CREATININE 0.82 07/20/2015 1148      Component  Value Date/Time   CALCIUM 9.9 03/10/2022 1451   ALKPHOS 82 12/21/2020 1041   AST 12 (L) 12/21/2020 1041   ALT 10 12/21/2020 1041   BILITOT 0.3 12/21/2020 1041       Impression and Plan: Yesenia Mills is a very pleasant 51 yo female with 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  She is symptomatic as mentioned above.  We will give her IV iron today and again next week.  Follow-up in 1 month.   [name], NP 12/6/20238:50 AM

## 2022-07-31 ENCOUNTER — Encounter: Payer: Self-pay | Admitting: Family

## 2022-07-31 NOTE — Progress Notes (Signed)
Error

## 2022-08-22 ENCOUNTER — Encounter: Payer: Self-pay | Admitting: Family

## 2022-08-22 ENCOUNTER — Inpatient Hospital Stay (HOSPITAL_BASED_OUTPATIENT_CLINIC_OR_DEPARTMENT_OTHER): Payer: No Typology Code available for payment source | Admitting: Family

## 2022-08-22 ENCOUNTER — Inpatient Hospital Stay: Payer: No Typology Code available for payment source | Attending: Hematology & Oncology

## 2022-08-22 ENCOUNTER — Inpatient Hospital Stay: Payer: No Typology Code available for payment source

## 2022-08-22 VITALS — BP 179/77 | HR 70 | Resp 16

## 2022-08-22 VITALS — BP 184/82 | HR 64 | Temp 98.1°F | Resp 17 | Wt 205.1 lb

## 2022-08-22 DIAGNOSIS — I10 Essential (primary) hypertension: Secondary | ICD-10-CM | POA: Insufficient documentation

## 2022-08-22 DIAGNOSIS — K909 Intestinal malabsorption, unspecified: Secondary | ICD-10-CM | POA: Insufficient documentation

## 2022-08-22 DIAGNOSIS — D509 Iron deficiency anemia, unspecified: Secondary | ICD-10-CM | POA: Diagnosis not present

## 2022-08-22 DIAGNOSIS — D508 Other iron deficiency anemias: Secondary | ICD-10-CM | POA: Diagnosis present

## 2022-08-22 DIAGNOSIS — Z79899 Other long term (current) drug therapy: Secondary | ICD-10-CM | POA: Diagnosis not present

## 2022-08-22 LAB — FERRITIN: Ferritin: 47 ng/mL (ref 11–307)

## 2022-08-22 LAB — CBC WITH DIFFERENTIAL (CANCER CENTER ONLY)
Abs Immature Granulocytes: 0.08 10*3/uL — ABNORMAL HIGH (ref 0.00–0.07)
Basophils Absolute: 0 10*3/uL (ref 0.0–0.1)
Basophils Relative: 0 %
Eosinophils Absolute: 0.1 10*3/uL (ref 0.0–0.5)
Eosinophils Relative: 1 %
HCT: 31.9 % — ABNORMAL LOW (ref 36.0–46.0)
Hemoglobin: 9.8 g/dL — ABNORMAL LOW (ref 12.0–15.0)
Immature Granulocytes: 1 %
Lymphocytes Relative: 31 %
Lymphs Abs: 2 10*3/uL (ref 0.7–4.0)
MCH: 25.2 pg — ABNORMAL LOW (ref 26.0–34.0)
MCHC: 30.7 g/dL (ref 30.0–36.0)
MCV: 82 fL (ref 80.0–100.0)
Monocytes Absolute: 0.4 10*3/uL (ref 0.1–1.0)
Monocytes Relative: 7 %
Neutro Abs: 4 10*3/uL (ref 1.7–7.7)
Neutrophils Relative %: 60 %
Platelet Count: 351 10*3/uL (ref 150–400)
RBC: 3.89 MIL/uL (ref 3.87–5.11)
RDW: 23.9 % — ABNORMAL HIGH (ref 11.5–15.5)
WBC Count: 6.6 10*3/uL (ref 4.0–10.5)
nRBC: 0 % (ref 0.0–0.2)

## 2022-08-22 LAB — RETICULOCYTES
Immature Retic Fract: 23.5 % — ABNORMAL HIGH (ref 2.3–15.9)
RBC.: 3.98 MIL/uL (ref 3.87–5.11)
Retic Count, Absolute: 75.2 10*3/uL (ref 19.0–186.0)
Retic Ct Pct: 1.9 % (ref 0.4–3.1)

## 2022-08-22 MED ORDER — SODIUM CHLORIDE 0.9 % IV SOLN: Freq: Once | INTRAVENOUS | Status: AC

## 2022-08-22 MED ORDER — SODIUM CHLORIDE 0.9 % IV SOLN
510.0000 mg | Freq: Once | INTRAVENOUS | Status: AC
  Administered 2022-08-22: 510 mg via INTRAVENOUS
  Filled 2022-08-22: qty 510

## 2022-08-22 NOTE — Progress Notes (Signed)
Hematology and Oncology Follow Up Visit  Yesenia Mills [id number] 11/02/1970 52 y.o. 08/22/2022   Principle Diagnosis:  Iron deficiency anemia, possibly due to GI blood loss    Current Therapy:        IV iron as indicated   Interim History:  Yesenia Mills is here today for follow-up. She still notes fatigue. She has also been having HTN with headaches off and on. She verbalized that she is taking her Norvasc and HCTZ daily. She will follow-up with her PCP.  No fever, chills, n/v, cough, rash, dizziness, SOB, chest pain, palpitations, abdominal pain or changes in bowel or bladder habits.  No swelling, tenderness, numbness or tingling in her extremities at this time.  No falls or syncope.  No blood loss, abnormal bruising or petechiae noted.  Appetite has been decreased lately. She is doing her best to stay well hydrated. Her weight is stable at 205 lbs.   ECOG Performance Status: 1 - Symptomatic but completely ambulatory  Medications:  Allergies as of 08/22/2022   No Known Allergies      Medication List        Accurate as of August 22, 2022 12:54 PM. If you have any questions, ask your nurse or doctor.          amLODipine 5 MG tablet Commonly known as: NORVASC TAKE 1 TABLET(5 MG) BY MOUTH DAILY   hydrochlorothiazide 25 MG tablet Commonly known as: HYDRODIURIL Take 1 tablet (25 mg total) by mouth daily. Needs appointment for further refills.   omeprazole 40 MG capsule Commonly known as: PRILOSEC TAKE 1 CAPSULE(40 MG) BY MOUTH DAILY        Allergies: No Known Allergies  Past Medical History, Surgical history, Social history, and Family History were reviewed and updated.  Review of Systems: All other 10 point review of systems is negative.   Physical Exam:  vitals were not taken for this visit.   Wt Readings from Last 3 Encounters:  07/25/22 204 lb 1.9 oz (92.6 kg)  03/10/22 200 lb (90.7 kg)  02/16/21 208 lb 8 oz (94.6 kg)    Ocular: Sclerae unicteric,  pupils equal, round and reactive to light Ear-nose-throat: Oropharynx clear, dentition fair Lymphatic: No cervical or supraclavicular adenopathy Lungs no rales or rhonchi, good excursion bilaterally Heart regular rate and rhythm, no murmur appreciated Abd soft, nontender, positive bowel sounds MSK no focal spinal tenderness, no joint edema Neuro: non-focal, well-oriented, appropriate affect Breasts: Deferred   Lab Results  Component Value Date   WBC 6.4 07/25/2022   HGB 8.0 (L) 07/25/2022   HCT 27.2 (L) 07/25/2022   MCV 75.8 (L) 07/25/2022   PLT 358 07/25/2022   Lab Results  Component Value Date   FERRITIN 4 (L) 07/25/2022   IRON 9 (L) 07/25/2022   TIBC 447 07/25/2022   UIBC 438 07/25/2022   IRONPCTSAT 2 (L) 07/25/2022   Lab Results  Component Value Date   RETICCTPCT 1.1 12/21/2020   RBC 3.59 (L) 07/25/2022   No results found for: "KPAFRELGTCHN", "LAMBDASER", "KAPLAMBRATIO" No results found for: "IGGSERUM", "IGA", "IGMSERUM" No results found for: "TOTALPROTELP", "ALBUMINELP", "A1GS", "A2GS", "BETS", "BETA2SER", "GAMS", "MSPIKE", "SPEI"   Chemistry      Component Value Date/Time   NA 140 07/25/2022 0816   K 4.3 07/25/2022 0816   CL 106 07/25/2022 0816   CO2 28 07/25/2022 0816   BUN 9 07/25/2022 0816   CREATININE 0.91 07/25/2022 0816   CREATININE 0.82 07/20/2015 1148      Component  Value Date/Time   CALCIUM 9.4 07/25/2022 0816   ALKPHOS 70 07/25/2022 0816   AST 11 (L) 07/25/2022 0816   ALT 8 07/25/2022 0816   BILITOT 0.5 07/25/2022 0816       Impression and Plan: Yesenia Mills is a very pleasant 52 yo female with 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  She will get her second dose of Feraheme today as planned.  Iron studies are pending.  Lab only in 2 months and follow-up in 4 months.   [name], NP 1/3/202412:54 PM

## 2022-08-22 NOTE — Patient Instructions (Signed)

## 2022-08-23 LAB — IRON AND IRON BINDING CAPACITY (CC-WL,HP ONLY)
Iron: 44 ug/dL (ref 28–170)
Saturation Ratios: 13 % (ref 10.4–31.8)
TIBC: 340 ug/dL (ref 250–450)
UIBC: 296 ug/dL (ref 148–442)

## 2022-10-22 ENCOUNTER — Inpatient Hospital Stay: Payer: No Typology Code available for payment source | Attending: Hematology & Oncology

## 2022-12-24 ENCOUNTER — Inpatient Hospital Stay: Payer: No Typology Code available for payment source | Admitting: Family

## 2022-12-24 ENCOUNTER — Inpatient Hospital Stay: Payer: No Typology Code available for payment source | Attending: Hematology & Oncology

## 2023-01-08 ENCOUNTER — Other Ambulatory Visit: Payer: Self-pay | Admitting: Family Medicine

## 2023-01-08 ENCOUNTER — Other Ambulatory Visit: Payer: Self-pay | Admitting: Nurse Practitioner

## 2023-01-08 DIAGNOSIS — I10 Essential (primary) hypertension: Secondary | ICD-10-CM

## 2023-01-08 DIAGNOSIS — Z8711 Personal history of peptic ulcer disease: Secondary | ICD-10-CM

## 2023-02-05 ENCOUNTER — Other Ambulatory Visit: Payer: Self-pay | Admitting: Nurse Practitioner

## 2023-02-05 DIAGNOSIS — I10 Essential (primary) hypertension: Secondary | ICD-10-CM

## 2023-12-12 ENCOUNTER — Emergency Department (HOSPITAL_BASED_OUTPATIENT_CLINIC_OR_DEPARTMENT_OTHER)

## 2023-12-12 ENCOUNTER — Encounter (HOSPITAL_BASED_OUTPATIENT_CLINIC_OR_DEPARTMENT_OTHER): Payer: Self-pay | Admitting: Emergency Medicine

## 2023-12-12 ENCOUNTER — Other Ambulatory Visit: Payer: Self-pay

## 2023-12-12 ENCOUNTER — Emergency Department (HOSPITAL_BASED_OUTPATIENT_CLINIC_OR_DEPARTMENT_OTHER)
Admission: EM | Admit: 2023-12-12 | Discharge: 2023-12-12 | Disposition: A | Discharge status: Home / Self Care | Attending: Emergency Medicine | Admitting: Emergency Medicine

## 2023-12-12 DIAGNOSIS — I159 Secondary hypertension, unspecified: Secondary | ICD-10-CM

## 2023-12-12 DIAGNOSIS — Z79899 Other long term (current) drug therapy: Secondary | ICD-10-CM | POA: Diagnosis not present

## 2023-12-12 DIAGNOSIS — R079 Chest pain, unspecified: Secondary | ICD-10-CM

## 2023-12-12 DIAGNOSIS — R072 Precordial pain: Secondary | ICD-10-CM | POA: Diagnosis present

## 2023-12-12 DIAGNOSIS — R519 Headache, unspecified: Secondary | ICD-10-CM

## 2023-12-12 DIAGNOSIS — I10 Essential (primary) hypertension: Secondary | ICD-10-CM

## 2023-12-12 LAB — BASIC METABOLIC PANEL WITH GFR
Anion gap: 16 — ABNORMAL HIGH (ref 5–15)
BUN: 9 mg/dL (ref 6–20)
CO2: 18 mmol/L — ABNORMAL LOW (ref 22–32)
Calcium: 10 mg/dL (ref 8.9–10.3)
Chloride: 104 mmol/L (ref 98–111)
Creatinine, Ser: 1.06 mg/dL — ABNORMAL HIGH (ref 0.44–1.00)
GFR, Estimated: >60 mL/min (ref >60)
Glucose, Bld: 75 mg/dL (ref 70–99)
Potassium: 3.8 mmol/L (ref 3.5–5.1)
Sodium: 138 mmol/L (ref 135–145)

## 2023-12-12 LAB — CBC
HCT: 33 % — ABNORMAL LOW (ref 36.0–46.0)
Hemoglobin: 10.1 g/dL — ABNORMAL LOW (ref 12.0–15.0)
MCH: 23.5 pg — ABNORMAL LOW (ref 26.0–34.0)
MCHC: 30.6 g/dL (ref 30.0–36.0)
MCV: 76.7 fL — ABNORMAL LOW (ref 80.0–100.0)
Platelets: 359 10*3/uL (ref 150–400)
RBC: 4.3 MIL/uL (ref 3.87–5.11)
RDW: 17.2 % — ABNORMAL HIGH (ref 11.5–15.5)
WBC: 6.2 10*3/uL (ref 4.0–10.5)
nRBC: 0 % (ref 0.0–0.2)

## 2023-12-12 LAB — TROPONIN T, HIGH SENSITIVITY: Troponin T High Sensitivity: <15 ng/L (ref <19)

## 2023-12-12 LAB — PREGNANCY, URINE: Preg Test, Ur: NEGATIVE

## 2023-12-12 MED ORDER — AMLODIPINE BESYLATE 10 MG PO TABS: 10.0000 mg | ORAL_TABLET | Freq: Every day | ORAL | 0 refills | Status: AC

## 2023-12-12 MED ORDER — AMLODIPINE BESYLATE 5 MG PO TABS
5.0000 mg | ORAL_TABLET | Freq: Once | ORAL | Status: AC
  Administered 2023-12-12: 5 mg via ORAL
  Filled 2023-12-12: qty 1

## 2023-12-12 MED ORDER — DIPHENHYDRAMINE HCL 50 MG/ML IJ SOLN
12.5000 mg | Freq: Once | INTRAMUSCULAR | Status: AC
  Administered 2023-12-12: 12.5 mg via INTRAVENOUS
  Filled 2023-12-12: qty 1

## 2023-12-12 MED ORDER — PROCHLORPERAZINE EDISYLATE 10 MG/2ML IJ SOLN
5.0000 mg | Freq: Once | INTRAMUSCULAR | Status: AC
  Administered 2023-12-12: 5 mg via INTRAVENOUS
  Filled 2023-12-12: qty 2

## 2023-12-12 NOTE — ED Provider Notes (Signed)
 [facility] Provider Note   CSN: [id number] Arrival date & time: 12/12/23  1422     History  Chief Complaint  Patient presents with   Chest Pain    Yesenia Mills is a 53 y.o. female.  The history is provided by the patient.  Chest Pain Pain location:  Substernal area Pain quality: aching   Pain radiates to:  Does not radiate Pain severity:  Mild Duration:  8 hours Progression:  Resolved Chronicity:  New Context: at rest   Context: not breathing, not eating and not movement   Relieved by:  Nothing Worsened by:  Nothing Associated symptoms: headache   Associated symptoms: no abdominal pain, no anorexia, no anxiety, no back pain, no cough, no diaphoresis, no dizziness, no dysphagia, no fatigue, no fever, no numbness, no orthopnea, no palpitations, no PND, no shortness of breath, no syncope and no vomiting   Risk factors: hypertension   Risk factors: no coronary artery disease, no diabetes mellitus, no prior DVT/PE and no smoking        Home Medications Prior to Admission medications   Medication Sig Start Date End Date Taking? Authorizing Provider  amLODipine  (NORVASC ) 10 MG tablet Take 1 tablet (10 mg total) by mouth daily. 12/12/23 01/11/24 Yes Abra Lingenfelter, DO  hydrochlorothiazide  (HYDRODIURIL ) 25 MG tablet Take 1 tablet (25 mg total) by mouth daily. Needs appointment for further refills. 12/25/21   Nche, Connye Delaine, NP  omeprazole  (PRILOSEC) 40 MG capsule TAKE 1 CAPSULE(40 MG) BY MOUTH DAILY Patient not taking: Reported on 08/22/2022 11/29/21   Nche, Connye Delaine, NP      Allergies    Patient has no known allergies.    Review of Systems   Review of Systems  Constitutional:  Negative for diaphoresis, fatigue and fever.  HENT:  Negative for trouble swallowing.   Respiratory:  Negative for cough and shortness of breath.   Cardiovascular:  Positive for chest pain. Negative for palpitations, orthopnea, syncope and PND.   Gastrointestinal:  Negative for abdominal pain, anorexia and vomiting.  Musculoskeletal:  Negative for back pain.  Neurological:  Positive for headaches. Negative for dizziness and numbness.    Physical Exam Updated Vital Signs BP (!) 215/106   Pulse 71   Temp 98.1 F (36.7 C) (Oral)   Resp 17   Ht 5' (1.524 m)   Wt 90.7 kg   LMP 11/20/2023 (Approximate)   SpO2 100%   BMI 39.06 kg/m  Physical Exam Vitals and nursing note reviewed.  Constitutional:      General: She is not in acute distress.    Appearance: She is well-developed. She is not ill-appearing.  HENT:     Head: Normocephalic and atraumatic.  Eyes:     Extraocular Movements: Extraocular movements intact.     Conjunctiva/sclera: Conjunctivae normal.     Pupils: Pupils are equal, round, and reactive to light.  Cardiovascular:     Rate and Rhythm: Normal rate and regular rhythm.     Pulses:          Radial pulses are 2+ on the right side and 2+ on the left side.     Heart sounds: Normal heart sounds. No murmur heard. Pulmonary:     Effort: Pulmonary effort is normal. No respiratory distress.     Breath sounds: Normal breath sounds.  Abdominal:     Palpations: Abdomen is soft.     Tenderness: There is no abdominal tenderness.  Musculoskeletal:  General: No swelling. Normal range of motion.     Cervical back: Normal range of motion and neck supple.     Right lower leg: No edema.     Left lower leg: No edema.  Skin:    General: Skin is warm and dry.     Capillary Refill: Capillary refill takes less than 2 seconds.  Neurological:     General: No focal deficit present.     Mental Status: She is alert and oriented to person, place, and time.     Cranial Nerves: No cranial nerve deficit.     Motor: No weakness.     Comments: NORMAL STRength sensation, normal senastion, normal speech, normal coordination, normal finger to nose finger  Psychiatric:        Mood and Affect: Mood normal.     ED Results /  Procedures / Treatments   Labs (all labs ordered are listed, but only abnormal results are displayed) Labs Reviewed  BASIC METABOLIC PANEL WITH GFR - Abnormal; Notable for the following components:      Result Value   CO2 18 (*)    Creatinine, Ser 1.06 (*)    Anion gap 16 (*)    All other components within normal limits  CBC - Abnormal; Notable for the following components:   Hemoglobin 10.1 (*)    HCT 33.0 (*)    MCV 76.7 (*)    MCH 23.5 (*)    RDW 17.2 (*)    All other components within normal limits  PREGNANCY, URINE  TROPONIN T, HIGH SENSITIVITY    EKG EKG Interpretation Date/Time:  Thursday December 12 2023 14:32:39 EDT Ventricular Rate:  70 PR Interval:  155 QRS Duration:  100 QT Interval:  395 QTC Calculation: 427 R Axis:   86  Text Interpretation: Sinus rhythm Confirmed by Lowery Rue [phone]) on 12/12/2023 2:56:31 PM  Radiology CT Head Wo Contrast Result Date: 12/12/2023 CLINICAL DATA:  Headache EXAM: CT HEAD WITHOUT CONTRAST TECHNIQUE: Contiguous axial images were obtained from the base of the skull through the vertex without intravenous contrast. RADIATION DOSE REDUCTION: This exam was performed according to the departmental dose-optimization program which includes automated exposure control, adjustment of the mA and/or kV according to patient size and/or use of iterative reconstruction technique. COMPARISON:  CT brain 03/10/2022 FINDINGS: Brain: No acute territorial infarction, hemorrhage or intracranial mass. Nonenlarged ventricles Vascular: No hyperdense vessel or unexpected calcification. Skull: Normal. Negative for fracture or focal lesion. Sinuses/Orbits: No acute finding. Other: None IMPRESSION: Negative non contrasted CT appearance of the brain. Electronically Signed   By: Esmeralda Hedge M.D.   On: 12/12/2023 16:24   DG Chest 2 View Result Date: 12/12/2023 CLINICAL DATA:  Chest pain EXAM: CHEST - 2 VIEW COMPARISON:  August 15, 2018 FINDINGS: The heart size and  mediastinal contours are within normal limits. Both lungs are clear. The visualized skeletal structures are unremarkable. IMPRESSION: No active cardiopulmonary disease. Electronically Signed   By: Fredrich Jefferson M.D.   On: 12/12/2023 15:25    Procedures Procedures    Medications Ordered in ED Medications  amLODipine  (NORVASC ) tablet 5 mg (has no administration in time range)  diphenhydrAMINE  (BENADRYL ) injection 12.5 mg (12.5 mg Intravenous Given 12/12/23 1612)  prochlorperazine  (COMPAZINE ) injection 5 mg (5 mg Intravenous Given 12/12/23 1613)    ED Course/ Medical Decision Making/ A&P  Medical Decision Making Amount and/or Complexity of Data Reviewed Labs: ordered. Radiology: ordered.  Risk Prescription drug management.   Alitza Cowman is here with headache and some palpitations chest pain and found to have high blood pressure at her work today.  Blood pressure 215/106.  She takes 5 mg of amlodipine  and 25 mg of hydrochlorothiazide .  Is not having any active symptoms now.  Still with mild headache.  Denies any fall.  She has not noticed of her blood pressures been high in the past.  She has not a history of migraines.  She denies any fever chills cough sputum production.  Nothing makes things worse or better.  She neurologically intact.  She is very well-appearing.  EKG shows sinus rhythm per my review interpretation.  Overall I have no concern for stroke.  She has normal exam.  Will check basic labs including troponin head CT give headache cocktail Compazine  Benadryl  and I will increase her amlodipine  to 10 mg daily and give her 5 mg dose today.  Overall lab work shows no significant anemia electrolyte O'Malley kidney injury or leukocytosis.  Troponin normal.  Head CT is unremarkable per radiology report.  Overall I suspect may be a stress migraine or tension type migraine.  She is not altered at all.  Have no concern for stroke.  Blood pressure is greatly  improved with headache cocktail blood pressure now 180/89 which is a great reduction from her blood pressure here.  Will make adjustment to her amlodipine  and have her follow-up closely with her primary care doctor for further medication adjustment as needed.  She was told to return if symptoms worsen.  Discharged in good condition.  This chart was dictated using voice recognition software.  Despite best efforts to proofread,  errors can occur which can change the documentation meaning.         Final Clinical Impression(s) / ED Diagnoses Final diagnoses:  Nonspecific chest pain  Secondary hypertension  Nonintractable headache, unspecified chronicity pattern, unspecified headache type    Rx / DC Orders ED Discharge Orders          Ordered    amLODipine  (NORVASC ) 10 MG tablet  Daily        12/12/23 1659              Lowery Rue, DO 12/12/23 1659

## 2023-12-12 NOTE — ED Notes (Signed)

## 2023-12-12 NOTE — ED Triage Notes (Signed)
 Pt POV steady gait- reports chest pain and headache since waking this AM.  Had BP checked while at work and has been hypertensive today (220/100 while at work).   Reports compliance with medications, denies recent changes. VAN neg, BEFAST neg.

## 2024-05-25 ENCOUNTER — Emergency Department (HOSPITAL_BASED_OUTPATIENT_CLINIC_OR_DEPARTMENT_OTHER)
Admission: EM | Admit: 2024-05-25 | Discharge: 2024-05-25 | Disposition: A | Discharge status: Home / Self Care | Attending: Emergency Medicine | Admitting: Emergency Medicine

## 2024-05-25 ENCOUNTER — Other Ambulatory Visit: Payer: Self-pay

## 2024-05-25 ENCOUNTER — Encounter (HOSPITAL_BASED_OUTPATIENT_CLINIC_OR_DEPARTMENT_OTHER): Payer: Self-pay

## 2024-05-25 DIAGNOSIS — R1032 Left lower quadrant pain: Secondary | ICD-10-CM | POA: Diagnosis present

## 2024-05-25 DIAGNOSIS — L02214 Cutaneous abscess of groin: Secondary | ICD-10-CM | POA: Insufficient documentation

## 2024-05-25 MED ORDER — LIDOCAINE HCL (PF) 1 % IJ SOLN
10.0000 mL | Freq: Once | INTRAMUSCULAR | Status: AC
  Administered 2024-05-25: 10 mL
  Filled 2024-05-25: qty 10

## 2024-05-25 MED ORDER — OXYCODONE-ACETAMINOPHEN 5-325 MG PO TABS
1.0000 | ORAL_TABLET | Freq: Once | ORAL | Status: AC
  Administered 2024-05-25: 1 via ORAL
  Filled 2024-05-25: qty 1

## 2024-05-25 MED ORDER — IBUPROFEN 800 MG PO TABS
800.0000 mg | ORAL_TABLET | Freq: Once | ORAL | Status: AC
  Administered 2024-05-25: 800 mg via ORAL
  Filled 2024-05-25: qty 1

## 2024-05-25 NOTE — ED Provider Notes (Signed)
 [facility] Provider Note   CSN: [id number] Arrival date & time: 05/25/24  1017     Patient presents with: Abscess   Yesenia Mills is a 53 y.o. female with no significant past medical history who presents with concern for a painful bump to the left groin area for the past 3 days.  Reports she got this area waxed and then the bump started to form.  She denies any drainage from the area.  Denies any fever or chills.    Abscess      Prior to Admission medications   Medication Sig Start Date End Date Taking? Authorizing Provider  amLODipine  (NORVASC ) 10 MG tablet Take 1 tablet (10 mg total) by mouth daily. 12/12/23 01/11/24  Curatolo, Adam, DO  hydrochlorothiazide  (HYDRODIURIL ) 25 MG tablet Take 1 tablet (25 mg total) by mouth daily. Needs appointment for further refills. 12/25/21   Nche, Roselie Rockford, NP  omeprazole  (PRILOSEC) 40 MG capsule TAKE 1 CAPSULE(40 MG) BY MOUTH DAILY Patient not taking: Reported on 08/22/2022 11/29/21   Nche, Roselie Rockford, NP    Allergies: Patient has no known allergies.    Review of Systems  Skin:  Positive for color change.    Updated Vital Signs BP (!) 172/76   Pulse 65   Temp 98.3 F (36.8 C) (Oral)   Resp 16   Ht 5' (1.524 m)   Wt 90.7 kg   LMP 05/17/2024 (Exact Date)   SpO2 100%   BMI 39.06 kg/m   Physical Exam Vitals and nursing note reviewed. Exam conducted with a chaperone present.  Constitutional:      Appearance: Normal appearance.  HENT:     Head: Atraumatic.  Pulmonary:     Effort: Pulmonary effort is normal.  Genitourinary:    Comments: NT Kim present to chaperone exam  Patient with 3 cm raised, fluctuant area to the left thigh/groin region.  No active drainage.  Mild surrounding erythema.  No surrounding induration. Skin:    Findings: Abscess present.      Neurological:     General: No focal deficit present.     Mental Status: She is alert.  Psychiatric:        Mood and  Affect: Mood normal.        Behavior: Behavior normal.     (all labs ordered are listed, but only abnormal results are displayed) Labs Reviewed - No data to display  EKG: None  Radiology: No results found.   SABRAUltrasound ED Soft Tissue  Date/Time: 05/25/2024 1:59 PM  Performed by: Veta Palma, PA-C Authorized by: Veta Palma, PA-C   Procedure details:    Indications: localization of abscess and evaluate for cellulitis     Transverse view:  Visualized   Longitudinal view:  Visualized   Images: archived   Location:    Location: groin     Side:  Left Findings:     abscess present    no cellulitis present Comments:     Large 2cmx2cm hypoechoic area noted .Incision and Drainage  Date/Time: 05/25/2024 1:59 PM  Performed by: Veta Palma, PA-C Authorized by: Veta Palma, PA-C   Consent:    Consent obtained:  Verbal   Consent given by:  Patient   Risks, benefits, and alternatives were discussed: yes     Risks discussed:  Incomplete drainage, pain, infection and bleeding   Alternatives discussed:  No treatment Universal protocol:    Procedure explained and questions answered to patient or proxy's  satisfaction: yes     Patient identity confirmed:  Verbally with patient Location:    Type:  Abscess   Location: Left upper thigh crease. Pre-procedure details:    Skin preparation:  Chlorhexidine Sedation:    Sedation type:  None Anesthesia:    Anesthesia method:  Local infiltration   Local anesthetic:  Lidocaine 1% w/o epi (3ml) Procedure type:    Complexity:  Complex Procedure details:    Incision types:  Stab incision   Incision depth:  Dermal   Wound management:  Probed and deloculated and irrigated with saline   Drainage:  Purulent   Drainage amount:  Moderate   Wound treatment:  Wound left open Post-procedure details:    Procedure completion:  Tolerated well, no immediate complications    Medications Ordered in the ED  ibuprofen   (ADVIL ) tablet 800 mg (800 mg Oral Given 05/25/24 1229)  oxyCODONE -acetaminophen  (PERCOCET/ROXICET) 5-325 MG per tablet 1 tablet (1 tablet Oral Given 05/25/24 1230)  lidocaine (PF) (XYLOCAINE) 1 % injection 10 mL (10 mLs Infiltration Given by Other 05/25/24 1230)                                    Medical Decision Making Risk Prescription drug management.    Differential diagnosis includes but is not limited to folliculitis, abscess, cellulitis  ED Course:  Upon initial evaluation, patient is well-appearing, no acute distress.  With chaperone present, patient's area of concern was examined.  She has a raised, 3 cm area of induration along the left upper thigh.  Area is tender to palpation.  No active drainage.  Mild swelling erythema.  No induration.  I performed bedside ultrasound which showed a hypoechoic area consistent with abscess.  I discussed recommended treatment of incision and drainage with the patient, and she agrees to proceed with I&D of this area for treatment. No fever or tachycardia to suggest systemic infection.  Medications Given: Ibuprofen  Percocet  Patient's abscess was cleaned with chlorhexidine swabs. Anesthesia achieved with lidocaine.  I&D was performed of the area and a moderate amount of purulent material was expelled.  Patient tolerated this well without any immediate complications.  I applied a gauze dressing on this area as there was mild drainage and bleeding upon discharge.  Patient stable and appropriate for discharge home.     Impression: Left upper thigh/groin abscess  Disposition:  Patient discharged home with instructions to keep laceration site clean with gentle soap and water. ` Apply warm compresses to the area 2-3 times a day to help promote drainage.  Keep covered with sterile dressing as shown here.  Follow-up with PCP if symptoms not improved within the next 5 days.  Tylenol  and ibuprofen  as needed for pain.  Return precautions  given.    This chart was dictated using voice recognition software, Dragon. Despite the best efforts of this provider to proofread and correct errors, errors may still occur which can change documentation meaning.       Final diagnoses:  Abscess of left groin    ED Discharge Orders     None          Veta Palma, [place] 05/25/24 1405    Darra Fonda MATSU, MD 05/25/24 1454

## 2024-05-25 NOTE — ED Notes (Signed)
 Discharge instructions reviewed with patient. Patient verbalizes understanding, no further questions at this time. Medications and follow up information provided. No acute distress noted at time of departure.

## 2024-05-25 NOTE — Discharge Instructions (Signed)
 You had your abscess drained here today.  There is a small laceration (cut) where it was drained.  This will heal on its own. You may gently clean the area around your laceration as needed with soap and water.   Do not submerge your laceration in water (no baths, swimming) until it is fully healed (about 5 days). You may shower.   Please allow the area to keep draining if it is still doing so.  Please apply warm compresses to the area 2-3 times a day for 10 minutes at a time to help promote drainage.  Take 600mg  ibuprofen , then 3 hours later take 1000mg  tylenol , then 3 hours later 600mg  ibuprofen , then 3 hours later 1000mg  tylenol , so on and so forth for pain control.   You had your first dose of ibuprofen  here today.  Your next dose can be no sooner than 8:30pm tonight.  Please follow-up with your PCP if your symptoms do not start to improve within the next 5 days.  Return to the ER should you develop fever, chills, worsening pain, any other new or concerning symptoms

## 2024-05-25 NOTE — ED Triage Notes (Signed)
 States has either an ingrown hair or boil in left groin x 3 days. Denies fever.
